# Patient Record
Sex: Female | Born: 1995 | Race: Black or African American | Hispanic: No | Marital: Single | Smoking: Never smoker
Health system: Southern US, Community
[De-identification: ages and names within clinical notes are randomized; demographics above are authoritative.]

## PROBLEM LIST (undated history)

## (undated) ENCOUNTER — Inpatient Hospital Stay (HOSPITAL_COMMUNITY): Payer: Self-pay

## (undated) DIAGNOSIS — F329 Major depressive disorder, single episode, unspecified: Secondary | ICD-10-CM

## (undated) DIAGNOSIS — M1990 Unspecified osteoarthritis, unspecified site: Secondary | ICD-10-CM

## (undated) DIAGNOSIS — J45909 Unspecified asthma, uncomplicated: Secondary | ICD-10-CM

## (undated) HISTORY — PX: TONSILLECTOMY: SUR1361

---

## 2015-07-31 ENCOUNTER — Encounter (HOSPITAL_COMMUNITY): Payer: Self-pay | Admitting: *Deleted

## 2015-07-31 ENCOUNTER — Emergency Department (HOSPITAL_COMMUNITY)
Admission: EM | Admit: 2015-07-31 | Discharge: 2015-07-31 | Disposition: A | Discharge status: Home / Self Care | Payer: Self-pay | Attending: Emergency Medicine | Admitting: Emergency Medicine

## 2015-07-31 ENCOUNTER — Emergency Department (HOSPITAL_COMMUNITY): Payer: Self-pay

## 2015-07-31 DIAGNOSIS — J069 Acute upper respiratory infection, unspecified: Secondary | ICD-10-CM | POA: Insufficient documentation

## 2015-07-31 DIAGNOSIS — Z8739 Personal history of other diseases of the musculoskeletal system and connective tissue: Secondary | ICD-10-CM | POA: Insufficient documentation

## 2015-07-31 DIAGNOSIS — Z880 Allergy status to penicillin: Secondary | ICD-10-CM | POA: Insufficient documentation

## 2015-07-31 DIAGNOSIS — J45901 Unspecified asthma with (acute) exacerbation: Secondary | ICD-10-CM | POA: Insufficient documentation

## 2015-07-31 DIAGNOSIS — F419 Anxiety disorder, unspecified: Secondary | ICD-10-CM | POA: Insufficient documentation

## 2015-07-31 HISTORY — DX: Unspecified osteoarthritis, unspecified site: M19.90

## 2015-07-31 HISTORY — DX: Unspecified asthma, uncomplicated: J45.909

## 2015-07-31 MED ORDER — PREDNISONE 50 MG PO TABS: ORAL_TABLET | ORAL | Status: DC

## 2015-07-31 MED ORDER — ALBUTEROL SULFATE HFA 108 (90 BASE) MCG/ACT IN AERS: 1.0000 | INHALATION_SPRAY | RESPIRATORY_TRACT | Status: AC | PRN

## 2015-07-31 MED ORDER — IPRATROPIUM-ALBUTEROL 0.5-2.5 (3) MG/3ML IN SOLN
3.0000 mL | Freq: Once | RESPIRATORY_TRACT | Status: AC
  Administered 2015-07-31: 3 mL via RESPIRATORY_TRACT
  Filled 2015-07-31: qty 3

## 2015-07-31 MED ORDER — PREDNISONE 20 MG PO TABS
50.0000 mg | ORAL_TABLET | Freq: Once | ORAL | Status: AC
  Administered 2015-07-31: 50 mg via ORAL
  Filled 2015-07-31: qty 3

## 2015-07-31 NOTE — ED Provider Notes (Signed)
CSN: [id number]     Arrival date & time 07/31/15  1240 History  By signing my name below, I, Soijett Blue, attest that this documentation has been prepared under the direction and in the presence of Alveta Heimlich, PA-C Electronically Signed: Soijett Blue, ED Scribe. 07/31/2015. 2:03 PM.   Chief Complaint  Patient presents with  . Asthma   The history is provided by the patient. No language interpreter was used.   Terri Stafford is a 19 y.o. female with a medical hx of asthma who presents to the Emergency Department complaining of moderate wheezing and shortness of breath onset last night. She is currently out of her rescue inhaler and her nebulizer is broken at this time. She notes that she has had asthma since she was 22 months old and has always used her nebulizer to self treat. She has been using her nebulizer periodically over the past two weeks, which is normal for her. She states that she has two nebulizer machines at home in [place]. She notes that she saw a medic at her work on Saturday who informed that she should come to the ED for further evaluation. Pt informs that she has had 53 admissions and 10 ICU admissions for her asthma related SOB. She has associated symptoms of non-productive cough and chest congestion x 2 weeks. She notes that she has not tried any medications for the relief of her symptoms. She denies any other symptoms. She goes to school at [employer] and does not have a PCP in the area. She used to have her inhaler and nebulizer Rx to her by her old PCP whom she stopped seeing when she came to college.   Past Medical History  Diagnosis Date  . Asthma   . Arthritis    History reviewed. No pertinent past surgical history. History reviewed. No pertinent family history. Social History  Substance Use Topics  . Smoking status: Never Smoker   . Smokeless tobacco: None  . Alcohol Use: No   OB History    No data available     Review of Systems  Constitutional: Negative  for fever and chills.  HENT: Negative for congestion, ear pain, rhinorrhea and sore throat.   Respiratory: Positive for cough, shortness of breath and wheezing. Negative for chest tightness.   Cardiovascular: Negative for chest pain.  Gastrointestinal: Negative for nausea, vomiting and abdominal pain.  Neurological: Negative for dizziness and headaches.  All other systems reviewed and are negative.   Allergies  Penicillins  Home Medications   Prior to Admission medications   Medication Sig Start Date End Date Taking? Authorizing Provider  albuterol (PROVENTIL HFA;VENTOLIN HFA) 108 (90 BASE) MCG/ACT inhaler Inhale 1-2 puffs into the lungs every 4 (four) hours as needed for wheezing or shortness of breath. 07/31/15   Rolm Gala Reve Crocket, PA-C  predniSONE (DELTASONE) 50 MG tablet Take once a day for 5 days 07/31/15   Windell Musson, PA-C   BP 115/70 mmHg  Pulse 70  Temp(Src) 98 F (36.7 C) (Oral)  Resp 20  SpO2 100%  LMP 07/28/2015 Physical Exam  Constitutional: She is oriented to person, place, and time. She appears well-developed and well-nourished. She appears distressed.  Patient very anxious on presentation to emergency department and is loudly hyperventilating  HENT:  Head: Normocephalic and atraumatic.  Right Ear: External ear normal.  Left Ear: External ear normal.  Eyes: Conjunctivae are normal. Right eye exhibits no discharge. Left eye exhibits no discharge. No scleral icterus.  Neck: Normal range of  motion.  Cardiovascular: Normal rate and normal heart sounds.   Pulmonary/Chest: Effort normal. No accessory muscle usage. No respiratory distress. She has no decreased breath sounds. She has wheezes.  On initial presentation, patient was anxious and breathing loudly masking any adventitious lung sounds. Mildly tachypneic between 22 and 24. After receiving DuoNeb, patient breathing unlabored with rare wheezes heard in upper lung fields. Good air movement in all lung fields. No  accessory muscle use.   Musculoskeletal: Normal range of motion.  Moves all extremities spontaneously  Neurological: She is alert and oriented to person, place, and time. Coordination normal.  Skin: Skin is warm and dry.  Psychiatric: She has a normal mood and affect. Her behavior is normal.  Nursing note and vitals reviewed.   ED Course  Procedures (including critical care time) DIAGNOSTIC STUDIES: Oxygen Saturation is 96% on RA, nl by my interpretation.    COORDINATION OF CARE: 2:01 PM Discussed treatment plan with pt at bedside which includes CXR, breathing treatment, prednisone Rx, inhaler Rx, referral and f/u with [facility], and pt agreed to plan. After receiving the breathing treatment, pt notes that she feels better and that her symptoms have been moderately relieved.   2:50 PM- Pt reassessed after receiving second breathing treatment and she notes that her symptoms are resolved at this time.   Labs Review Labs Reviewed - No data to display  Imaging Review Dg Chest 2 View  07/31/2015  CLINICAL DATA:  Cough, wheezing and shortness of breath for the past 2 weeks. Asthma. EXAM: CHEST  2 VIEW COMPARISON:  None. FINDINGS: Normal sized heart. Clear lungs. Central peribronchial thickening. Unremarkable bones. IMPRESSION: Mild bronchitic changes. Electronically Signed   By: Beckie SaltsSteven  Reid M.D.   On: 07/31/2015 14:14   I have personally reviewed and evaluated these images as part of my medical decision-making.   EKG Interpretation None      MDM   Final diagnoses:  Asthma exacerbation  URI (upper respiratory infection)   Patient presenting with asthma exacerbation. Treated with 2 duonebs in ED with significant improvement in lung sounds and symptoms. Chest x-ray with mild bronchitic changes. Patient ambulated in ED with O2 saturations maintained >90. There are no current signs of respiratory distress. Prednisone given in the ED and pt will be discharged with 5  day burst and albuterol. Instructed to get her breathing machine from home so she may continue her breathing treatments. Pt states she is breathing at baseline. Pt has been instructed to follow up with community health and wellness regarding today's ED visit. Return precautions given in discharge paperwork and discussed with pt at bedside. Pt stable for discharge  I personally performed the services described in this documentation, which was scribed in my presence. The recorded information has been reviewed and is accurate.    Alveta HeimlichStevi Tonantzin Mimnaugh, PA-C 07/31/15 1527  Eber HongBrian Miller, MD 08/02/15 [phone]

## 2015-07-31 NOTE — ED Notes (Signed)
Declined W/C at D/C and was escorted to lobby by RN. 

## 2015-07-31 NOTE — ED Notes (Signed)
Pt given warm blanked and stated "My legs and arms feels numb"

## 2015-07-31 NOTE — ED Notes (Signed)
PT states " MY oxygen is always 100% and I get admitted to ICU."

## 2015-07-31 NOTE — ED Notes (Signed)
PT requested  A facial mask for medication delivery . Pt stated "She was to weak to hold the NEB  Equipment."

## 2015-07-31 NOTE — ED Notes (Signed)
Pt reports hx of asthma, reports multiple trips to ED this past week due to cough and asthma. Is out of inhalers and reports her nebulizer is broken. Speaking in full sentences at triage.

## 2015-07-31 NOTE — ED Notes (Signed)
PT reports her legs and arms feel numb. PA made aware of Pt reported Sx'S.

## 2015-07-31 NOTE — Discharge Instructions (Signed)
Take 1 tablet of prednisone a day for 5 days. Use the albuterol rescue inhaler as needed for wheezing and shortness of breath. Schedule a follow-up appointment with community health and wellness. It is important to establish primary care and now that you are a resident of the area. Get your second nebulizer machine from home and use as needed. Return to the emergency department with fevers, severe shortness of breath, severe wheezing and any other new or concerning symptoms.   Asthma, Adult Asthma is a recurring condition in which the airways tighten and narrow. Asthma can make it difficult to breathe. It can cause coughing, wheezing, and shortness of breath. Asthma episodes, also called asthma attacks, range from minor to life-threatening. Asthma cannot be cured, but medicines and lifestyle changes can help control it. CAUSES Asthma is believed to be caused by inherited (genetic) and environmental factors, but its exact cause is unknown. Asthma may be triggered by allergens, lung infections, or irritants in the air. Asthma triggers are different for each person. Common triggers include:   Animal dander.  Dust mites.  Cockroaches.  Pollen from trees or grass.  Mold.  Smoke.  Air pollutants such as dust, household cleaners, hair sprays, aerosol sprays, paint fumes, strong chemicals, or strong odors.  Cold air, weather changes, and winds (which increase molds and pollens in the air).  Strong emotional expressions such as crying or laughing hard.  Stress.  Certain medicines (such as aspirin) or types of drugs (such as beta-blockers).  Sulfites in foods and drinks. Foods and drinks that may contain sulfites include dried fruit, potato chips, and sparkling grape juice.  Infections or inflammatory conditions such as the flu, a cold, or an inflammation of the nasal membranes (rhinitis).  Gastroesophageal reflux disease (GERD).  Exercise or strenuous activity. SYMPTOMS Symptoms may  occur immediately after asthma is triggered or many hours later. Symptoms include:  Wheezing.  Excessive nighttime or early morning coughing.  Frequent or severe coughing with a common cold.  Chest tightness.  Shortness of breath. DIAGNOSIS  The diagnosis of asthma is made by a review of your medical history and a physical exam. Tests may also be performed. These may include:  Lung function studies. These tests show how much air you breathe in and out.  Allergy tests.  Imaging tests such as X-rays. TREATMENT  Asthma cannot be cured, but it can usually be controlled. Treatment involves identifying and avoiding your asthma triggers. It also involves medicines. There are 2 classes of medicine used for asthma treatment:   Controller medicines. These prevent asthma symptoms from occurring. They are usually taken every day.  Reliever or rescue medicines. These quickly relieve asthma symptoms. They are used as needed and provide short-term relief. Your health care provider will help you create an asthma action plan. An asthma action plan is a written plan for managing and treating your asthma attacks. It includes a list of your asthma triggers and how they may be avoided. It also includes information on when medicines should be taken and when their dosage should be changed. An action plan may also involve the use of a device called a peak flow meter. A peak flow meter measures how well the lungs are working. It helps you monitor your condition. HOME CARE INSTRUCTIONS   Take medicines only as directed by your health care provider. Speak with your health care provider if you have questions about how or when to take the medicines.  Use a peak flow meter as directed  by your health care provider. Record and keep track of readings.  Understand and use the action plan to help minimize or stop an asthma attack without needing to seek medical care.  Control your home environment in the following  ways to help prevent asthma attacks:  Do not smoke. Avoid being exposed to secondhand smoke.  Change your heating and air conditioning filter regularly.  Limit your use of fireplaces and wood stoves.  Get rid of pests (such as roaches and mice) and their droppings.  Throw away plants if you see mold on them.  Clean your floors and dust regularly. Use unscented cleaning products.  Try to have someone else vacuum for you regularly. Stay out of rooms while they are being vacuumed and for a short while afterward. If you vacuum, use a dust mask from a hardware store, a double-layered or microfilter vacuum cleaner bag, or a vacuum cleaner with a HEPA filter.  Replace carpet with wood, tile, or vinyl flooring. Carpet can trap dander and dust.  Use allergy-proof pillows, mattress covers, and box spring covers.  Wash bed sheets and blankets every week in hot water and dry them in a dryer.  Use blankets that are made of polyester or cotton.  Clean bathrooms and kitchens with bleach. If possible, have someone repaint the walls in these rooms with mold-resistant paint. Keep out of the rooms that are being cleaned and painted.  Wash hands frequently. SEEK MEDICAL CARE IF:   You have wheezing, shortness of breath, or a cough even if taking medicine to prevent attacks.  The colored mucus you cough up (sputum) is thicker than usual.  Your sputum changes from clear or white to yellow, green, gray, or bloody.  You have any problems that may be related to the medicines you are taking (such as a rash, itching, swelling, or trouble breathing).  You are using a reliever medicine more than 2-3 times per week.  Your peak flow is still at 50-79% of your personal best after following your action plan for 1 hour.  You have a fever. SEEK IMMEDIATE MEDICAL CARE IF:   You seem to be getting worse and are unresponsive to treatment during an asthma attack.  You are short of breath even at rest.  You  get short of breath when doing very little physical activity.  You have difficulty eating, drinking, or talking due to asthma symptoms.  You develop chest pain.  You develop a fast heartbeat.  You have a bluish color to your lips or fingernails.  You are light-headed, dizzy, or faint.  Your peak flow is less than 50% of your personal best.   This information is not intended to replace advice given to you by your health care provider. Make sure you discuss any questions you have with your health care provider.   Document Released: 08/19/2005 Document Revised: 05/10/2015 Document Reviewed: 03/18/2013 Elsevier Interactive Patient Education 2016 [employer]  Asthma, Acute Bronchospasm Acute bronchospasm caused by asthma is also referred to as an asthma attack. Bronchospasm means your air passages become narrowed. The narrowing is caused by inflammation and tightening of the muscles in the air tubes (bronchi) in your lungs. This can make it hard to breathe or cause you to wheeze and cough. CAUSES Possible triggers are:  Animal dander from the skin, hair, or feathers of animals.  Dust mites contained in house dust.  Cockroaches.  Pollen from trees or grass.  Mold.  Cigarette or tobacco smoke.  Air pollutants  such as dust, household cleaners, hair sprays, aerosol sprays, paint fumes, strong chemicals, or strong odors.  Cold air or weather changes. Cold air may trigger inflammation. Winds increase molds and pollens in the air.  Strong emotions such as crying or laughing hard.  Stress.  Certain medicines such as aspirin or beta-blockers.  Sulfites in foods and drinks, such as dried fruits and wine.  Infections or inflammatory conditions, such as a flu, cold, or inflammation of the nasal membranes (rhinitis).  Gastroesophageal reflux disease (GERD). GERD is a condition where stomach acid backs up into your esophagus.  Exercise or strenuous activity. SIGNS AND SYMPTOMS     Wheezing.  Excessive coughing, particularly at night.  Chest tightness.  Shortness of breath. DIAGNOSIS  Your health care provider will ask you about your medical history and perform a physical exam. A chest X-ray or blood testing may be performed to look for other causes of your symptoms or other conditions that may have triggered your asthma attack. TREATMENT  Treatment is aimed at reducing inflammation and opening up the airways in your lungs. Most asthma attacks are treated with inhaled medicines. These include quick relief or rescue medicines (such as bronchodilators) and controller medicines (such as inhaled corticosteroids). These medicines are sometimes given through an inhaler or a nebulizer. Systemic steroid medicine taken by mouth or given through an IV tube also can be used to reduce the inflammation when an attack is moderate or severe. Antibiotic medicines are only used if a bacterial infection is present.  HOME CARE INSTRUCTIONS   Rest.  Drink plenty of liquids. This helps the mucus to remain thin and be easily coughed up. Only use caffeine in moderation and do not use alcohol until you have recovered from your illness.  Do not smoke. Avoid being exposed to secondhand smoke.  You play a critical role in keeping yourself in good health. Avoid exposure to things that cause you to wheeze or to have breathing problems.  Keep your medicines up-to-date and available. Carefully follow your health care provider's treatment plan.  Take your medicine exactly as prescribed.  When pollen or pollution is bad, keep windows closed and use an air conditioner or go to places with air conditioning.  Asthma requires careful medical care. See your health care provider for a follow-up as advised. If you are more than [redacted] weeks pregnant and you were prescribed any new medicines, let your obstetrician know about the visit and how you are doing. Follow up with your health care provider as  directed.  After you have recovered from your asthma attack, make an appointment with your outpatient doctor to talk about ways to reduce the likelihood of future attacks. If you do not have a doctor who manages your asthma, make an appointment with a primary care doctor to discuss your asthma. SEEK IMMEDIATE MEDICAL CARE IF:   You are getting worse.  You have trouble breathing. If severe, call your local emergency services (911 in the [place]  You develop chest pain or discomfort.  You are vomiting.  You are not able to keep fluids down.  You are coughing up yellow, green, brown, or bloody sputum.  You have a fever and your symptoms suddenly get worse.  You have trouble swallowing. MAKE SURE YOU:   Understand these instructions.  Will watch your condition.  Will get help right away if you are not doing well or get worse.   This information is not intended to replace advice given to you  by your health care provider. Make sure you discuss any questions you have with your health care provider.   Document Released: 12/04/2006 Document Revised: 08/24/2013 Document Reviewed: 02/24/2013 Elsevier Interactive Patient Education [employer]

## 2015-07-31 NOTE — ED Notes (Signed)
Pt placed on monitor, pt o2 100% room air heart rate 74. Pt able to answer question in full sentences.

## 2015-12-27 ENCOUNTER — Emergency Department (HOSPITAL_COMMUNITY)
Admission: EM | Admit: 2015-12-27 | Discharge: 2015-12-28 | Disposition: A | Discharge status: Other Acute Inpatient Hospital | Payer: Self-pay | Attending: Emergency Medicine | Admitting: Emergency Medicine

## 2015-12-27 ENCOUNTER — Encounter (HOSPITAL_COMMUNITY): Payer: Self-pay

## 2015-12-27 DIAGNOSIS — Z79899 Other long term (current) drug therapy: Secondary | ICD-10-CM | POA: Insufficient documentation

## 2015-12-27 DIAGNOSIS — R45851 Suicidal ideations: Secondary | ICD-10-CM

## 2015-12-27 DIAGNOSIS — F329 Major depressive disorder, single episode, unspecified: Secondary | ICD-10-CM | POA: Insufficient documentation

## 2015-12-27 DIAGNOSIS — Z3202 Encounter for pregnancy test, result negative: Secondary | ICD-10-CM | POA: Insufficient documentation

## 2015-12-27 DIAGNOSIS — F1210 Cannabis abuse, uncomplicated: Secondary | ICD-10-CM | POA: Insufficient documentation

## 2015-12-27 DIAGNOSIS — Z880 Allergy status to penicillin: Secondary | ICD-10-CM | POA: Insufficient documentation

## 2015-12-27 DIAGNOSIS — J45909 Unspecified asthma, uncomplicated: Secondary | ICD-10-CM | POA: Insufficient documentation

## 2015-12-27 DIAGNOSIS — F32A Depression, unspecified: Secondary | ICD-10-CM

## 2015-12-27 LAB — COMPREHENSIVE METABOLIC PANEL
ALT: 16 U/L (ref 14–54)
AST: 19 U/L (ref 15–41)
Albumin: 4.5 g/dL (ref 3.5–5.0)
Alkaline Phosphatase: 69 U/L (ref 38–126)
Anion gap: 9 (ref 5–15)
BILIRUBIN TOTAL: 0.6 mg/dL (ref 0.3–1.2)
BUN: 17 mg/dL (ref 6–20)
CALCIUM: 10.3 mg/dL (ref 8.9–10.3)
CHLORIDE: 105 mmol/L (ref 101–111)
CO2: 27 mmol/L (ref 22–32)
CREATININE: 0.77 mg/dL (ref 0.44–1.00)
Glucose, Bld: 89 mg/dL (ref 65–99)
Potassium: 4.2 mmol/L (ref 3.5–5.1)
Sodium: 141 mmol/L (ref 135–145)
TOTAL PROTEIN: 8 g/dL (ref 6.5–8.1)

## 2015-12-27 LAB — CBC
HCT: 36.9 % (ref 36.0–46.0)
Hemoglobin: 12.1 g/dL (ref 12.0–15.0)
MCH: 26.4 pg (ref 26.0–34.0)
MCHC: 32.8 g/dL (ref 30.0–36.0)
MCV: 80.6 fL (ref 78.0–100.0)
PLATELETS: 544 10*3/uL — AB (ref 150–400)
RBC: 4.58 MIL/uL (ref 3.87–5.11)
RDW: 16.3 % — AB (ref 11.5–15.5)
WBC: 7.5 10*3/uL (ref 4.0–10.5)

## 2015-12-27 LAB — ACETAMINOPHEN LEVEL: Acetaminophen (Tylenol), Serum: <10 ug/mL — ABNORMAL LOW (ref 10–30)

## 2015-12-27 LAB — SALICYLATE LEVEL

## 2015-12-27 LAB — RAPID URINE DRUG SCREEN, HOSP PERFORMED
Amphetamines: NOT DETECTED
Barbiturates: NOT DETECTED
Benzodiazepines: NOT DETECTED
Cocaine: NOT DETECTED
OPIATES: NOT DETECTED
Tetrahydrocannabinol: POSITIVE — AB

## 2015-12-27 LAB — I-STAT BETA HCG BLOOD, ED (MC, WL, AP ONLY)

## 2015-12-27 LAB — ETHANOL

## 2015-12-27 MED ORDER — ACETAMINOPHEN 325 MG PO TABS: 650.0000 mg | ORAL_TABLET | ORAL | Status: DC | PRN

## 2015-12-27 MED ORDER — ZOLPIDEM TARTRATE 5 MG PO TABS
5.0000 mg | ORAL_TABLET | Freq: Every evening | ORAL | Status: DC | PRN
  Administered 2015-12-27: 5 mg via ORAL
  Filled 2015-12-27: qty 1

## 2015-12-27 MED ORDER — IBUPROFEN 200 MG PO TABS: 600.0000 mg | ORAL_TABLET | Freq: Three times a day (TID) | ORAL | Status: DC | PRN

## 2015-12-27 MED ORDER — ALBUTEROL SULFATE HFA 108 (90 BASE) MCG/ACT IN AERS: 1.0000 | INHALATION_SPRAY | RESPIRATORY_TRACT | Status: DC | PRN

## 2015-12-27 NOTE — ED Notes (Signed)
Patient states she is having suicidal thoughts with no plans. Patient denies Hi, visual or auditory hallucinations. Patient states she has a lot going on. Patient denies any drug or alcohol use. Patient tearful during triage.

## 2015-12-27 NOTE — ED Notes (Signed)
Patient's friend took belongings bag x2 home with her

## 2015-12-27 NOTE — BH Assessment (Signed)
Assessment Note  Terri Sianez is an 20 y.o. female. Patient presents to to [facility] for a TTS assessment. She was referred by her therapist at [facility] counseling center. Patient reports increased depression. Her depression is triggered by guilt. She is the mother of a 33 yr old boy. She left her son in [place], [place] to be raised by her family so that she is able to finish school. Patient feels bad that she is not around to raise her son. She is in her 3rd semester of college. Patient reports depressive symptoms of hopelessness, isolating self from others, crying spells, and fatigue. Patient is also grieving. Sts that the father of her son passed away 07-08-14. Patient has suicidal thoughts, no plan, no intent. She denies history of suicide attempts. She denies self mutilating behaviors. Patient however starves herself or refused to sleep as a form of punishing herself. She denies HI and AVH's. She is calm and cooperative. She reports regular use of THC.   Diagnosis: Major Depressive Disorder, Recurrent, Severe, without psychotic features  Past Medical History:  Past Medical History  Diagnosis Date  . Asthma   . Arthritis     Past Surgical History  Procedure Laterality Date  . Tonsillectomy      Family History: No family history on file.  Social History:  reports that she has never smoked. She has never used smokeless tobacco. She reports that she does not drink alcohol or use illicit drugs.  Additional Social History:  Alcohol / Drug Use Pain Medications: SEE MAR Prescriptions: SEE MAR Over the Counter: SEE MAR History of alcohol / drug use?: Yes Substance #1 Name of Substance 1: THC 1 - Age of First Use: 20 yrs old  1 - Amount (size/oz): varies 1 - Frequency: daily  1 - Duration: on-going 1 - Last Use / Amount: 2am this morning  CIWA: CIWA-Ar BP: 120/72 mmHg Pulse Rate: 62 COWS:    Allergies:  Allergies  Allergen Reactions  . Penicillins     Unknown    Home Medications:   (Not in a hospital admission)  OB/GYN Status:  Patient's last menstrual period was 12/20/2015.  General Assessment Data Location of Assessment: [facility] TTS Assessment: In system Is this a Tele or Face-to-Face Assessment?: Face-to-Face Is this an Initial Assessment or a Re-assessment for this encounter?: Initial Assessment Marital status: Single Maiden name:  (n/a) Is patient pregnant?: No Pregnancy Status: Yes (Comment: include estimated delivery date) Living Arrangements: Other (Comment), Alone Can pt return to current living arrangement?: Yes Admission Status: Voluntary Is patient capable of signing voluntary admission?: Yes Referral Source: Self/Family/Friend Insurance type:  (Self Pay "I let me Medicaid lapse")     Crisis Care Plan Living Arrangements: Other (Comment), Alone Legal Guardian:  (no legal guardian ) Name of Psychiatrist:  (no psychiatrist ) Name of Therapist:  (No therapist )  Education Status Is patient currently in school?: Yes Current Grade:  (n/a) Highest grade of school patient has completed:  (n/a) Name of school:  ([employer]) Contact person:  (n/a)  Risk to self with the past 6 months Suicidal Ideation: Yes-Currently Present Has patient been a risk to self within the past 6 months prior to admission? : Yes Suicidal Intent: No Has patient had any suicidal intent within the past 6 months prior to admission? : No Is patient at risk for suicide?: No Suicidal Plan?: No Has patient had any suicidal plan within the past 6 months prior to admission? : No Access to Means: No  What has been your use of drugs/alcohol within the last 12 months?:  (thc) Previous Attempts/Gestures: No How many times?:  (n/a) Other Self Harm Risks:  (n/a) Triggers for Past Attempts:  (no previous attempts or gestures ) Intentional Self Injurious Behavior:  ("I will not eat .Marland KitchenMarland KitchenMarland KitchenI wil not sleep to punish myself") Family Suicide History: No Recent stressful life event(s): Other  (Comment) (Child left in [place] while I finish school; bereavement) Persecutory voices/beliefs?: No Depression: Yes Depression Symptoms: Feeling angry/irritable, Feeling worthless/self pity, Loss of interest in usual pleasures, Guilt, Fatigue, Isolating, Tearfulness, Insomnia, Despondent Substance abuse history and/or treatment for substance abuse?: No Suicide prevention information given to non-admitted patients: Not applicable  Risk to Others within the past 6 months Homicidal Ideation: No Does patient have any lifetime risk of violence toward others beyond the six months prior to admission? : No Thoughts of Harm to Others: No Current Homicidal Intent: No Current Homicidal Plan: No Access to Homicidal Means: No Identified Victim:  (n/a) History of harm to others?: No Assessment of Violence: None Noted Violent Behavior Description:  (patient is calm and cooperative ) Does patient have access to weapons?: No Criminal Charges Pending?: No Does patient have a court date: No Is patient on probation?: No  Psychosis Hallucinations: None noted Delusions: None noted  Mental Status Report Appearance/Hygiene: Disheveled Eye Contact: Fair Motor Activity: Freedom of movement Speech: Logical/coherent Level of Consciousness: Alert Mood: Depressed Affect: Appropriate to circumstance Anxiety Level: None Thought Processes: Coherent, Relevant Judgement: Impaired Orientation: Person, Place, Time, Situation Obsessive Compulsive Thoughts/Behaviors: None  Cognitive Functioning Concentration: Decreased Memory: Recent Intact, Remote Intact IQ: Average Insight: Good Impulse Control: Fair Appetite: Good Weight Loss:  ("My weight fluctuates") Weight Gain:  ("My wt. fluctuates") Sleep: Decreased Total Hours of Sleep:  (varies ) Vegetative Symptoms: None  ADLScreening [facility] Assessment Services) Patient's cognitive ability adequate to safely complete daily activities?: Yes Patient able  to express need for assistance with ADLs?: Yes Independently performs ADLs?: Yes (appropriate for developmental age)  Prior Inpatient Therapy Prior Inpatient Therapy: No Prior Therapy Dates:  (n/a) Prior Therapy Facilty/Provider(s):  (n/a) Reason for Treatment:  (n/a)  Prior Outpatient Therapy Prior Outpatient Therapy: Yes Prior Therapy Dates:  (current ) Prior Therapy Facilty/Provider(s):  ([employer] counseling department) Reason for Treatment:  (depression ) Does patient have an ACCT team?: No Does patient have Intensive In-House Services?  : No Does patient have [facility] services? : No Does patient have P4CC services?: No  ADL Screening (condition at time of admission) Patient's cognitive ability adequate to safely complete daily activities?: Yes Is the patient deaf or have difficulty hearing?: No Does the patient have difficulty seeing, even when wearing glasses/contacts?: No Does the patient have difficulty concentrating, remembering, or making decisions?: No Patient able to express need for assistance with ADLs?: Yes Does the patient have difficulty dressing or bathing?: No Independently performs ADLs?: Yes (appropriate for developmental age) Does the patient have difficulty walking or climbing stairs?: No Weakness of Legs: None Weakness of Arms/Hands: None  Home Assistive Devices/Equipment Home Assistive Devices/Equipment: None    Abuse/Neglect Assessment (Assessment to be complete while patient is alone) Physical Abuse: Denies Verbal Abuse: Denies Sexual Abuse: Denies Exploitation of patient/patient's resources: Denies Self-Neglect: Denies Values / Beliefs Cultural Requests During Hospitalization: None Spiritual Requests During Hospitalization: None   Advance Directives (For Healthcare) Does patient have an advance directive?: No Would patient like information on creating an advanced directive?: No - patient declined information Nutrition Screen- [facility]  Adult/[facility] Patient's  home diet: Regular  Additional Information 1:1 In Past 12 Months?: No CIRT Risk: No Elopement Risk: No Does patient have medical clearance?: No     Disposition:  Disposition Initial Assessment Completed for this Encounter: Yes Disposition of Patient: Inpatient treatment program (Per Julieanne CottonJosephine, NP patient meets criteria for OBS or INPT) Type of inpatient treatment program: Adult  On Site Evaluation by:   Reviewed with Physician:    Melynda RipplePerry, Daisia Slomski [facility] 12/27/2015 6:30 PM

## 2015-12-27 NOTE — ED Provider Notes (Signed)
CSN: [id number]     Arrival date & time 12/27/15  1429 History   First MD Initiated Contact with Patient 12/27/15 1550     Chief Complaint  Patient presents with  . Suicidal     (Consider location/radiation/quality/duration/timing/severity/associated sxs/prior Treatment) HPI Comments: PT with a hx of arthritis and asthma presents with depression and SI.  No specific plan.  No hx of suicide attempts in the past.  Pt denies any medical complaints.  Spoke with school counselor today and was advised to come here.   Past Medical History  Diagnosis Date  . Asthma   . Arthritis    Past Surgical History  Procedure Laterality Date  . Tonsillectomy     No family history on file. Social History  Substance Use Topics  . Smoking status: Never Smoker   . Smokeless tobacco: Never Used  . Alcohol Use: No   OB History    No data available     Review of Systems  Constitutional: Negative for fever, chills, diaphoresis and fatigue.  HENT: Negative for congestion, rhinorrhea and sneezing.   Eyes: Negative.   Respiratory: Negative for cough, chest tightness and shortness of breath.   Cardiovascular: Negative for chest pain and leg swelling.  Gastrointestinal: Negative for nausea, vomiting, abdominal pain, diarrhea and blood in stool.  Genitourinary: Negative for frequency, hematuria, flank pain and difficulty urinating.  Musculoskeletal: Negative for back pain and arthralgias.  Skin: Negative for rash.  Neurological: Negative for dizziness, speech difficulty, weakness, numbness and headaches.  Psychiatric/Behavioral: Positive for suicidal ideas and dysphoric mood. Negative for self-injury.      Allergies  Penicillins  Home Medications   Prior to Admission medications   Medication Sig Start Date End Date Taking? Authorizing Provider  albuterol (PROVENTIL HFA;VENTOLIN HFA) 108 (90 BASE) MCG/ACT inhaler Inhale 1-2 puffs into the lungs every 4 (four) hours as needed for wheezing or  shortness of breath. 07/31/15   Rolm GalaStevi Barrett, PA-C  predniSONE (DELTASONE) 50 MG tablet Take once a day for 5 days Patient not taking: Reported on 12/27/2015 07/31/15   Stevi Barrett, PA-C   BP 120/72 mmHg  Pulse 62  Temp(Src) 98.6 F (37 C) (Oral)  Resp 16  Ht 5\' 2"  (1.575 m)  Wt 149 lb 6 oz (67.756 kg)  BMI 27.31 kg/m2  SpO2 97%  LMP 12/20/2015 Physical Exam  Constitutional: She is oriented to person, place, and time. She appears well-developed and well-nourished.  HENT:  Head: Normocephalic and atraumatic.  Eyes: Pupils are equal, round, and reactive to light.  Neck: Normal range of motion. Neck supple.  Cardiovascular: Normal rate, regular rhythm and normal heart sounds.   Pulmonary/Chest: Effort normal and breath sounds normal. No respiratory distress. She has no wheezes. She has no rales. She exhibits no tenderness.  Abdominal: Soft. Bowel sounds are normal. There is no tenderness. There is no rebound and no guarding.  Musculoskeletal: Normal range of motion. She exhibits no edema.  Lymphadenopathy:    She has no cervical adenopathy.  Neurological: She is alert and oriented to person, place, and time.  Skin: Skin is warm and dry. No rash noted.  Psychiatric: She has a normal mood and affect.    ED Course  Procedures (including critical care time) Labs Review Results for orders placed or performed during the hospital encounter of 12/27/15  Comprehensive metabolic panel  Result Value Ref Range   Sodium 141 135 - 145 mmol/L   Potassium 4.2 3.5 - 5.1 mmol/L   Chloride  105 101 - 111 mmol/L   CO2 27 22 - 32 mmol/L   Glucose, Bld 89 65 - 99 mg/dL   BUN 17 6 - 20 mg/dL   Creatinine, Ser 0.45 0.44 - 1.00 mg/dL   Calcium 40.9 8.9 - 81.1 mg/dL   Total Protein 8.0 6.5 - 8.1 g/dL   Albumin 4.5 3.5 - 5.0 g/dL   AST 19 15 - 41 U/L   ALT 16 14 - 54 U/L   Alkaline Phosphatase 69 38 - 126 U/L   Total Bilirubin 0.6 0.3 - 1.2 mg/dL   GFR calc non Af Amer >60 >60 mL/min   GFR  calc Af Amer >60 >60 mL/min   Anion gap 9 5 - 15  Ethanol (ETOH)  Result Value Ref Range   Alcohol, Ethyl (B) <5 <5 mg/dL  Salicylate level  Result Value Ref Range   Salicylate Lvl <4.0 2.8 - 30.0 mg/dL  Acetaminophen level  Result Value Ref Range   Acetaminophen (Tylenol), Serum <10 (L) 10 - 30 ug/mL  CBC  Result Value Ref Range   WBC 7.5 4.0 - 10.5 K/uL   RBC 4.58 3.87 - 5.11 MIL/uL   Hemoglobin 12.1 12.0 - 15.0 g/dL   HCT 91.4 78.2 - 95.6 %   MCV 80.6 78.0 - 100.0 fL   MCH 26.4 26.0 - 34.0 pg   MCHC 32.8 30.0 - 36.0 g/dL   RDW 21.3 (H) 08.6 - 57.8 %   Platelets 544 (H) 150 - 400 K/uL  Urine rapid drug screen (hosp performed) (Not at [facility])  Result Value Ref Range   Opiates NONE DETECTED NONE DETECTED   Cocaine NONE DETECTED NONE DETECTED   Benzodiazepines NONE DETECTED NONE DETECTED   Amphetamines NONE DETECTED NONE DETECTED   Tetrahydrocannabinol POSITIVE (A) NONE DETECTED   Barbiturates NONE DETECTED NONE DETECTED  I-Stat beta hCG blood, ED ([facility], [facility], [facility] only)  Result Value Ref Range   I-stat hCG, quantitative <5.0 <5 mIU/mL   Comment 3           No results found.    Imaging Review No results found. I have personally reviewed and evaluated these images and lab results as part of my medical decision-making.   EKG Interpretation None      MDM   Final diagnoses:  Depression  Suicidal ideation    Pt has been medically cleared.  Awaiting psych assessment    Rolan Bucco, MD 12/27/15 [phone]

## 2015-12-28 ENCOUNTER — Encounter (HOSPITAL_COMMUNITY): Payer: Self-pay

## 2015-12-28 ENCOUNTER — Inpatient Hospital Stay (HOSPITAL_COMMUNITY)
Admission: EM | Admit: 2015-12-28 | Discharge: 2016-01-01 | DRG: 885 | Disposition: A | Discharge status: Home / Self Care | Payer: Federal, State, Local not specified - Other | Source: Intra-hospital | Attending: Psychiatry | Admitting: Psychiatry

## 2015-12-28 ENCOUNTER — Inpatient Hospital Stay (HOSPITAL_COMMUNITY): Admit: 2015-12-28 | Payer: Self-pay

## 2015-12-28 DIAGNOSIS — F332 Major depressive disorder, recurrent severe without psychotic features: Secondary | ICD-10-CM | POA: Diagnosis not present

## 2015-12-28 DIAGNOSIS — F329 Major depressive disorder, single episode, unspecified: Secondary | ICD-10-CM | POA: Diagnosis present

## 2015-12-28 DIAGNOSIS — R45851 Suicidal ideations: Secondary | ICD-10-CM | POA: Diagnosis present

## 2015-12-28 MED ORDER — ACETAMINOPHEN 325 MG PO TABS: 650.0000 mg | ORAL_TABLET | Freq: Four times a day (QID) | ORAL | Status: DC | PRN

## 2015-12-28 MED ORDER — ALUM & MAG HYDROXIDE-SIMETH 200-200-20 MG/5ML PO SUSP: 30.0000 mL | Freq: Four times a day (QID) | ORAL | Status: DC | PRN

## 2015-12-28 MED ORDER — ENSURE ENLIVE PO LIQD
237.0000 mL | Freq: Two times a day (BID) | ORAL | Status: DC
  Administered 2015-12-28 – 2016-01-01 (×2): 237 mL via ORAL

## 2015-12-28 MED ORDER — TRAZODONE HCL 50 MG PO TABS
50.0000 mg | ORAL_TABLET | Freq: Every evening | ORAL | Status: DC | PRN
  Administered 2015-12-28 – 2015-12-29 (×2): 50 mg via ORAL
  Filled 2015-12-28 (×8): qty 1

## 2015-12-28 MED ORDER — ALUM & MAG HYDROXIDE-SIMETH 200-200-20 MG/5ML PO SUSP: 30.0000 mL | ORAL | Status: DC | PRN

## 2015-12-28 MED ORDER — ALBUTEROL SULFATE HFA 108 (90 BASE) MCG/ACT IN AERS
1.0000 | INHALATION_SPRAY | RESPIRATORY_TRACT | Status: DC | PRN
  Administered 2015-12-28: 2 via RESPIRATORY_TRACT
  Filled 2015-12-28: qty 6.7

## 2015-12-28 MED ORDER — MAGNESIUM HYDROXIDE 400 MG/5ML PO SUSP: 30.0000 mL | Freq: Every day | ORAL | Status: DC | PRN

## 2015-12-28 MED ORDER — LORAZEPAM 1 MG PO TABS
1.0000 mg | ORAL_TABLET | Freq: Once | ORAL | Status: AC
  Administered 2015-12-28: 1 mg via ORAL
  Filled 2015-12-28: qty 1

## 2015-12-28 MED ORDER — SERTRALINE HCL 25 MG PO TABS
25.0000 mg | ORAL_TABLET | Freq: Every day | ORAL | Status: DC
  Administered 2015-12-28: 25 mg via ORAL
  Filled 2015-12-28 (×5): qty 1

## 2015-12-28 MED ORDER — HYDROXYZINE HCL 25 MG PO TABS
25.0000 mg | ORAL_TABLET | Freq: Four times a day (QID) | ORAL | Status: DC | PRN
  Administered 2015-12-28 – 2015-12-31 (×2): 25 mg via ORAL
  Filled 2015-12-28: qty 1
  Filled 2015-12-28: qty 10
  Filled 2015-12-28: qty 1

## 2015-12-28 NOTE — Tx Team (Signed)
Initial Interdisciplinary Treatment Plan   PATIENT STRESSORS: Educational concerns Financial difficulties Occupational concerns Substance abuse   PATIENT STRENGTHS: Ability for insight Average or above average intelligence Capable of independent living Communication skills General fund of knowledge Motivation for treatment/growth Physical Health Supportive family/friends   PROBLEM LIST: Problem List/Patient Goals Date to be addressed Date deferred Reason deferred Estimated date of resolution  "depression" 12/28/2015   D/c  "anxiety" 12/28/2015   D/c  "suicidal thoughts" 12/28/2015   D/c  "substance abuse" 12/28/2015                                    DISCHARGE CRITERIA:  Ability to meet basic life and health needs Adequate post-discharge living arrangements Improved stabilization in mood, thinking, and/or behavior Medical problems require only outpatient monitoring Motivation to continue treatment in a less acute level of care Need for constant or close observation no longer present Reduction of life-threatening or endangering symptoms to within safe limits Safe-care adequate arrangements made Verbal commitment to aftercare and medication compliance  PRELIMINARY DISCHARGE PLAN: Attend aftercare/continuing care group Attend PHP/IOP Attend 12-step recovery group Outpatient therapy Return to previous living arrangement Return to previous work or school arrangements  PATIENT/FAMIILY INVOLVEMENT: This treatment plan has been presented to and reviewed with the patient, Terri Stafford.  The patient and family have been given the opportunity to ask questions and make suggestions.  [name], [name] 12/28/2015, 4:30 PM

## 2015-12-28 NOTE — Progress Notes (Addendum)
Patient [age] yrs old, [facility].  Stated she is very stressed about college, has small child [place] that [place], child's father died. [facility] for safety.  Denied HI.  Denied A/V hallucinations.  Denied pain.  Presently patient resting in bed with eyes closed.  Respirations even and unlabored.  No signs/symptoms of pain/distress noted on patient's face/body movements. Fall risk assessment completed, low fall risk. No locker needed for patient's belongings.   Patient oriented to 400 hall.  Offered food/drink.

## 2015-12-28 NOTE — H&P (Signed)
[facility] Provider Admission PAA/H&P  Patient Identification: Terri Stafford MRN:  [id number] Date of Evaluation:  12/28/2015 Chief Complaint:  MDD with Psych Features Principal Diagnosis: MDD (major depressive disorder), recurrent episode, severe ([place]) Diagnosis:   Patient Active Problem List   Diagnosis Date Noted  . MDD (major depressive disorder), recurrent episode, severe ([place]) [F33.2] 12/28/2015  . MDD (major depressive disorder) ([place]) [F32.9] 12/28/2015   History of Present Illness: Terri Stafford is an 20 y.o. female. Patient presents to to [facility] for a TTS assessment. She was referred by her therapist at [facility] counseling center. Patient reports increased depression. Her depression is triggered by guilt. She is the mother of a 33 yr old boy. She left her son in [place], [place] to be raised by her family so that she is able to finish school. Patient feels bad that she is not around to raise her son. She is in her 71rd semester of college. Patient reports depressive symptoms of hopelessness, isolating self from others, crying spells, and fatigue. Patient is also grieving. Sts that the father of her son passed away 07/16/14. Patient has suicidal thoughts, no plan, no intent. She denies history of suicide attempts. She denies self mutilating behaviors. Patient however starves herself or refused to sleep as a form of punishing herself. She denies HI and AVH's. She is calm and cooperative. She reports regular use of THC.   Associated Signs/Symptoms: Depression Symptoms:  depressed mood, anxiety, (Hypo) Manic Symptoms:  Labiality of Mood, Anxiety Symptoms:  Excessive Worry, Psychotic Symptoms:  NA PTSD Symptoms: NA Total Time spent with patient: 45 minutes  Past Psychiatric History: see above noted  Is the patient at risk to self? Yes.    Has the patient been a risk to self in the past 6 months? Yes.    Has the patient been a risk to self within the distant past? Yes.    Is the  patient a risk to others? No.  Has the patient been a risk to others in the past 6 months? No.  Has the patient been a risk to others within the distant past? No.   Prior Inpatient Therapy:   Prior Outpatient Therapy:    Alcohol Screening:   Substance Abuse History in the last 12 months:  No. Consequences of Substance Abuse: NA Previous Psychotropic Medications: Yes  Psychological Evaluations: Yes  Past Medical History:  Past Medical History  Diagnosis Date  . Asthma   . Arthritis     Past Surgical History  Procedure Laterality Date  . Tonsillectomy     Family History: History reviewed. No pertinent family history. Family Psychiatric History: Denies Tobacco Screening: '@FLOW' [phone]::1)@ Social History:  History  Alcohol Use No     History  Drug Use No    Additional Social History:      Pain Medications: SEE MAR Prescriptions: SEE MAR Over the Counter: SEE MAR History of alcohol / drug use?: Yes Name of Substance 1: THC 1 - Age of First Use: 20 yrs old  1 - Amount (size/oz): varies 1 - Frequency: daily  1 - Duration: on-going 1 - Last Use / Amount: 2am this morning  Allergies:   Allergies  Allergen Reactions  . Penicillins     Unknown   Lab Results:  Results for orders placed or performed during the hospital encounter of 12/27/15 (from the past 48 hour(s))  Comprehensive metabolic panel     Status: None   Collection Time: 12/27/15  3:44 PM  Result Value  Ref Range   Sodium 141 135 - 145 mmol/L   Potassium 4.2 3.5 - 5.1 mmol/L   Chloride 105 101 - 111 mmol/L   CO2 27 22 - 32 mmol/L   Glucose, Bld 89 65 - 99 mg/dL   BUN 17 6 - 20 mg/dL   Creatinine, Ser 0.77 0.44 - 1.00 mg/dL   Calcium 10.3 8.9 - 10.3 mg/dL   Total Protein 8.0 6.5 - 8.1 g/dL   Albumin 4.5 3.5 - 5.0 g/dL   AST 19 15 - 41 U/L   ALT 16 14 - 54 U/L   Alkaline Phosphatase 69 38 - 126 U/L   Total Bilirubin 0.6 0.3 - 1.2 mg/dL   GFR calc non Af Amer >60 >60 mL/min   GFR calc Af Amer  >60 >60 mL/min    Comment: (NOTE) The eGFR has been calculated using the CKD EPI equation. This calculation has not been validated in all clinical situations. eGFR's persistently <60 mL/min signify possible Chronic Kidney Disease.    Anion gap 9 5 - 15  Ethanol (ETOH)     Status: None   Collection Time: 12/27/15  3:44 PM  Result Value Ref Range   Alcohol, Ethyl (B) <5 <5 mg/dL    Comment:        LOWEST DETECTABLE LIMIT FOR SERUM ALCOHOL IS 5 mg/dL FOR MEDICAL PURPOSES ONLY   Salicylate level     Status: None   Collection Time: 12/27/15  3:44 PM  Result Value Ref Range   Salicylate Lvl <6.8 2.8 - 30.0 mg/dL  Acetaminophen level     Status: Abnormal   Collection Time: 12/27/15  3:44 PM  Result Value Ref Range   Acetaminophen (Tylenol), Serum <10 (L) 10 - 30 ug/mL    Comment:        THERAPEUTIC CONCENTRATIONS VARY SIGNIFICANTLY. A RANGE OF 10-30 ug/mL MAY BE AN EFFECTIVE CONCENTRATION FOR MANY PATIENTS. HOWEVER, SOME ARE BEST TREATED AT CONCENTRATIONS OUTSIDE THIS RANGE. ACETAMINOPHEN CONCENTRATIONS >150 ug/mL AT 4 HOURS AFTER INGESTION AND >50 ug/mL AT 12 HOURS AFTER INGESTION ARE OFTEN ASSOCIATED WITH TOXIC REACTIONS.   CBC     Status: Abnormal   Collection Time: 12/27/15  3:44 PM  Result Value Ref Range   WBC 7.5 4.0 - 10.5 K/uL   RBC 4.58 3.87 - 5.11 MIL/uL   Hemoglobin 12.1 12.0 - 15.0 g/dL   HCT 36.9 36.0 - 46.0 %   MCV 80.6 78.0 - 100.0 fL   MCH 26.4 26.0 - 34.0 pg   MCHC 32.8 30.0 - 36.0 g/dL   RDW 16.3 (H) 11.5 - 15.5 %   Platelets 544 (H) 150 - 400 K/uL  I-Stat beta hCG blood, ED ([facility], [facility], [facility] only)     Status: None   Collection Time: 12/27/15  3:50 PM  Result Value Ref Range   I-stat hCG, quantitative <5.0 <5 mIU/mL   Comment 3            Comment:   GEST. AGE      CONC.  (mIU/mL)   <=1 WEEK        5 - 50     2 WEEKS       50 - 500     3 WEEKS       100 - 10,000     4 WEEKS     1,000 - 30,000        FEMALE AND NON-PREGNANT FEMALE:     LESS THAN 5  mIU/mL  Urine rapid drug screen (hosp performed) (Not at [facility])     Status: Abnormal   Collection Time: 12/27/15  4:40 PM  Result Value Ref Range   Opiates NONE DETECTED NONE DETECTED   Cocaine NONE DETECTED NONE DETECTED   Benzodiazepines NONE DETECTED NONE DETECTED   Amphetamines NONE DETECTED NONE DETECTED   Tetrahydrocannabinol POSITIVE (A) NONE DETECTED   Barbiturates NONE DETECTED NONE DETECTED    Comment:        DRUG SCREEN FOR MEDICAL PURPOSES ONLY.  IF CONFIRMATION IS NEEDED FOR ANY PURPOSE, NOTIFY LAB WITHIN 5 DAYS.        LOWEST DETECTABLE LIMITS FOR URINE DRUG SCREEN Drug Class       Cutoff (ng/mL) Amphetamine      1000 Barbiturate      200 Benzodiazepine   322 Tricyclics       025 Opiates          300 Cocaine          300 THC              50     Blood Alcohol level:  Lab Results  Component Value Date   ETH <5 42/70/6237    Metabolic Disorder Labs:  No results found for: HGBA1C, MPG No results found for: PROLACTIN No results found for: CHOL, TRIG, HDL, CHOLHDL, VLDL, LDLCALC  Current Medications: Current Facility-Administered Medications  Medication Dose Route Frequency Provider Last Rate Last Dose  . acetaminophen (TYLENOL) tablet 650 mg  650 mg Oral Q6H PRN Kerrie Buffalo, NP      . albuterol (PROVENTIL HFA;VENTOLIN HFA) 108 (90 Base) MCG/ACT inhaler 1-2 puff  1-2 puff Inhalation Q4H PRN Laverle Hobby, PA-C   2 puff at 12/28/15 0318  . alum & mag hydroxide-simeth (MAALOX/MYLANTA) 200-200-20 MG/5ML suspension 30 mL  30 mL Oral Q4H PRN Kerrie Buffalo, NP      . feeding supplement (ENSURE ENLIVE) (ENSURE ENLIVE) liquid 237 mL  237 mL Oral BID BM Kerrie Buffalo, NP   237 mL at 12/28/15 1449  . hydrOXYzine (ATARAX/VISTARIL) tablet 25 mg  25 mg Oral Q6H PRN Laverle Hobby, PA-C   25 mg at 12/28/15 0220  . magnesium hydroxide (MILK OF MAGNESIA) suspension 30 mL  30 mL Oral Daily PRN Kerrie Buffalo, NP      . sertraline (ZOLOFT) tablet 25 mg  25 mg Oral  Daily Kerrie Buffalo, NP      . traZODone (DESYREL) tablet 50 mg  50 mg Oral QHS,MR X 1 Laverle Hobby, PA-C       PTA Medications: Prescriptions prior to admission  Medication Sig Dispense Refill Last Dose  . albuterol (PROVENTIL HFA;VENTOLIN HFA) 108 (90 BASE) MCG/ACT inhaler Inhale 1-2 puffs into the lungs every 4 (four) hours as needed for wheezing or shortness of breath. 1 Inhaler 0 unknown  . predniSONE (DELTASONE) 50 MG tablet Take once a day for 5 days (Patient not taking: Reported on 12/27/2015) 5 tablet 0 Not Taking at Unknown time    Musculoskeletal: Strength & Muscle Tone: within normal limits Gait & Station: normal Patient leans: Right and N/A  Psychiatric Specialty Exam: Physical Exam  Vitals reviewed. Psychiatric: Her mood appears anxious. Her affect is labile. She is withdrawn. She exhibits a depressed mood.   Review of Systems  Psychiatric/Behavioral: Positive for depression. Negative for hallucinations. The patient is nervous/anxious.    Blood pressure 107/69, pulse 64, temperature 98.5 F (36.9 C), temperature source Oral, resp. rate 18, height  '5\' 2"'  (1.575 m), weight 67.132 kg (148 lb), last menstrual period 12/20/2015, SpO2 100 %.Body mass index is 27.06 kg/(m^2). General Appearance: Neat Eye Contact:  Fair Speech:  Normal Rate Volume:  Normal Mood:  Anxious, Depressed and Worthless Affect:  Depressed and Tearful Thought Process:  Coherent Orientation:  Full (Time, Place, and Person) Thought Content:  Rumination Suicidal Thoughts:  No Homicidal Thoughts:  No Memory:  Immediate;   Fair Recent;   Fair Remote;   Fair Judgement:  Good Insight:  Good Psychomotor Activity:  Normal Concentration:  Good Recall:  Good Fund of Knowledge: Good Language: Good Akathisia:  Negative Handed:  Right AIMS (if indicated):    Assets:  Communication Skills Desire for Improvement Resilience ADL's:  Intact Cognition: WNL Sleep:     Treatment Plan Summary: Admit  to inpatient unit for further evaluation and treatment   Observation Level/Precautions:  15 minute checks Laboratory:  see lab results Psychotherapy:  group Medications:  As per medlist Consultations:  As needed Discharge Concerns:  safety Estimated LOS:  5-7 days Other:    Janett Labella, NP [facility] 4/27/20173:25 PM

## 2015-12-28 NOTE — Progress Notes (Signed)
Patient transferred to [address] hall.

## 2015-12-28 NOTE — Plan of Care (Signed)
Problem: Consults Goal: Depression Patient Education See Patient Education Module for education specifics.  Outcome: Progressing Nurse discussed depression/coping skills with patient.        

## 2015-12-28 NOTE — Progress Notes (Signed)
Patient ID: Terri [name], female   DOB: Jan 29, 1996, 20 y.o.   MRN: [id number] D: Client has visitors earlier this shift. Client reports since the death of BF years ago. Client reports no grief counseling "I just tried to deal with it on my own" "If it wasn't for the grace of God I don't know how I would have made it" "it's so hard, my son looks just like him" A: Clinical research associateWriter provides emotional support, encouraged client to consider grief counseling. Medications reviewed and administered as ordered. Staff will monitor q7515min for safety. R: Client is safe on the unit, did not attend group.

## 2015-12-28 NOTE — BHH Counselor (Signed)
Pt would benefit from receiving OPT services [facility] along with medication management. Pt will be observed throughout the night for crises intervention, safety and stabilization. TTS to evaluate in am to aid in plans for disposition. Pt is currently denying SI thoughts. Pt states that she does not want to be admitted for inpatient services and that she would like to be discharged home to [facility].    Ardelle ParkLatoya McNeil, MA OBS Counselor

## 2015-12-28 NOTE — Progress Notes (Signed)
Admission Note:  Terri Stafford admitted to Observation bed 4 at 01:15.  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  Pt DX: MDD, Recurrent, Severe, without psychotic features.  [facility]  Pt is very quiet with almost inaudible speech.  Flat affect and mostly teary while answering assessment questions.  Pt has child two years ago with the father no longer living.  Pt also states school is major stressor.  Pt believes all her stressors have come together and she is having difficulty dealing with life.  Pt also sts she has not had a chance to grieve for her child's father as well. Pt deals with stress by withholding food and sleep as way to punish herself.  Pt is intelligent and cooperative.  Pt has good circle of friends at school who were not aware of pt's increasing stress, anxiety and depression.  Pt's child is being cared for by Pt's mom.  Pt is continuously observed for safety when on unit except when in bathroom.

## 2015-12-28 NOTE — Tx Team (Signed)
Initial Interdisciplinary Treatment Plan   PATIENT STRESSORS: Educational concerns Traumatic event   PATIENT STRENGTHS: Average or above average intelligence Capable of independent living General fund of knowledge Supportive family/friends   PROBLEM LIST: Problem List/Patient Goals Date to be addressed Date deferred Reason deferred Estimated date of resolution  "Overwhelmed with school" 12/28/2015     Suicidal ideation 12/28/2015     Unresolved grief 12/28/2015                                          DISCHARGE CRITERIA:  Improved stabilization in mood, thinking, and/or behavior Motivation to continue treatment in a less acute level of care Need for constant or close observation no longer present Reduction of life-threatening or endangering symptoms to within safe limits Verbal commitment to aftercare and medication compliance  PRELIMINARY DISCHARGE PLAN: Attend aftercare/continuing care group Outpatient therapy Return to previous living arrangement Return to previous work or school arrangements  PATIENT/FAMIILY INVOLVEMENT: This treatment plan has been presented to and reviewed with the patient, Terri Stafford.  The patient and family have been given the opportunity to ask questions and make suggestions.  Cranford MonBeaudry, [name] 12/28/2015, 3:20 PM

## 2015-12-28 NOTE — BHH Group Notes (Signed)
Pt did not attend karaoke group.  Coburn Knaus, MHT  

## 2015-12-28 NOTE — Progress Notes (Signed)
Pt presents with sad, flat affect. Reports being sleepy and has slept most of morning. Pt currently endorsing depression, but denies SI. Pt would like to be discharged home. Pt remains safe on unit with continuous observation except while in bathroom.

## 2015-12-29 ENCOUNTER — Encounter (HOSPITAL_COMMUNITY): Payer: Self-pay | Admitting: Psychiatry

## 2015-12-29 LAB — TSH: TSH: 1.097 u[IU]/mL (ref 0.350–4.500)

## 2015-12-29 MED ORDER — MIRTAZAPINE 7.5 MG PO TABS
7.5000 mg | ORAL_TABLET | Freq: Every day | ORAL | Status: DC
  Administered 2015-12-29: 7.5 mg via ORAL
  Filled 2015-12-29 (×3): qty 1

## 2015-12-29 NOTE — Tx Team (Signed)
Interdisciplinary Treatment Plan Update (Adult) Date: 12/29/2015    Time Reviewed: 9:30 AM  Progress in Treatment: Attending groups: Continuing to assess, patient new to milieu Participating in groups: Continuing to assess, patient new to milieu Taking medication as prescribed: Yes Tolerating medication: Yes Family/Significant other contact made: No, CSW assessing for appropriate contacts Patient understands diagnosis: Yes Discussing patient identified problems/goals with staff: Yes Medical problems stabilized or resolved: Yes Denies suicidal/homicidal ideation: Yes Issues/concerns per patient self-inventory: Yes Other:  New problem(s) identified: N/A  Discharge Plan or Barriers: Home with outpatient.  Reason for Continuation of Hospitalization:  Depression Anxiety Medication Stabilization   Comments: N/A  Estimated length of stay: 3-5 days    Patient is a 20 year old female who  presented to the hospital with depression. Pt reports primary trigger(s) for admission was family stressors and grief of child's father's death. Patient will benefit from crisis stabilization, medication evaluation, group therapy and psycho education in addition to case management for discharge planning. At discharge, it is recommended that Pt remain compliant with established discharge plan and continued treatment.   Review of initial/current patient goals per problem list:  1. Goal(s): Patient will participate in aftercare plan   Met: Yes   Target date: 3-5 days post admission date   As evidenced by: Patient will participate within aftercare plan AEB aftercare provider and housing plan at discharge being identified.  4/28: Goal met. Patient plans to return home to follow up with outpatient services.    2. Goal (s): Patient will exhibit decreased depressive symptoms and suicidal ideations.   Met: No   Target date: 3-5 days post admission date   As evidenced by: Patient will  utilize self rating of depression at 3 or below and demonstrate decreased signs of depression or be deemed stable for discharge by MD.  4/28: Goal not met: Pt presents with flat affect and depressed mood.  Pt admitted with depression rating of 10.  Pt to show decreased sign of depression and a rating of 3 or less before d/c.       3. Goal(s): Patient will demonstrate decreased signs and symptoms of anxiety.   Met: No   Target date: 3-5 days post admission date   As evidenced by: Patient will utilize self rating of anxiety at 3 or below and demonstrated decreased signs of anxiety, or be deemed stable for discharge by MD  4/28: Goal not met: Pt presents with anxious mood and affect.  Pt admitted with anxiety rating of 10.  Pt to show decreased sign of anxiety and a rating of 3 or less before d/c.    Attendees: Patient:    Family:    Physician: Dr. Parke Poisson; Dr. Sabra Heck 12/29/2015 9:30 AM  Nursing: Jacqualine Code, RN 12/29/2015 9:30 AM  Clinical Social Worker: Tilden Fossa, LCSW 12/29/2015 9:30 AM  Other: Maxie Better, LCSW  12/29/2015 9:30 AM  Other: Norberto Sorenson, P4CC 12/29/2015 9:30 AM  Other: Lars Pinks, Case Manager 12/29/2015 9:30 AM  Other: Agustina Caroli, May Augustin, NP 12/29/2015 9:30 AM  Other:    Other:         Scribe for Treatment Team:  Tilden Fossa, [place]

## 2015-12-29 NOTE — BHH Group Notes (Signed)
BHH LCSW Aftercare Discharge Planning Group Note  12/29/2015  8:45 AM  Participation Quality: Did Not Attend. Patient invited to participate but declined.  Donatello Kleve, MSW, LCSW Clinical Social Worker [facility] [phone]   

## 2015-12-29 NOTE — Progress Notes (Signed)
Terri Stafford is isolated to her room today. She didn't want to get OOB this morning and slept in. She refused her daily zoloft and requests it to be dc'Terri. A SHe completed her daily assessment and on it she wrote she denied  SI today and she rated her depression, hopelessness and anxiety  " 0/10/    ", respectively. She still remains upset about missing hr child. This afternoon she is seen by this nurse..influenza the dayroom...laughing with other patients. R Safety is in place.

## 2015-12-29 NOTE — BHH Suicide Risk Assessment (Signed)
B[facility] INPATIENT:  Family/Significant Other Suicide Prevention Education  Suicide Prevention Education:  Education Completed; mother Rosebud PolesMargaret Yamamoto [phone],  (name of family member/significant other) has been identified by the patient as the family member/significant other with whom the patient will be residing, and identified as the person(s) who will aid the patient in the event of a mental health crisis (suicidal ideations/suicide attempt).  With written consent from the patient, the family member/significant other has been provided the following suicide prevention education, prior to the and/or following the discharge of the patient.  The suicide prevention education provided includes the following:  Suicide risk factors  Suicide prevention and interventions  National Suicide Hotline telephone number  [facility] assessment telephone number  [facility] Emergency Assistance 911  [facility] and/or Residential Mobile Crisis Unit telephone number  Request made of family/significant other to:  Remove weapons (e.g., guns, rifles, knives), all items previously/currently identified as safety concern.    Remove drugs/medications (over-the-counter, prescriptions, illicit drugs), all items previously/currently identified as a safety concern.  The family member/significant other verbalizes understanding of the suicide prevention education information provided.  The family member/significant other agrees to remove the items of safety concern listed above.  Terri Stafford, Terri Stafford 12/29/2015, 5:47 PM

## 2015-12-29 NOTE — [facility] Suicide Risk Assessment (Addendum)
[facility]   Nursing information obtained from:   patient and chart  Demographic factors:   20 year old single female, college student  Current Mental Status:   see below  Loss Factors:   death of boyfriend 2 years ago, college stressors, son lives with patient's mother Historical Factors:   depression Risk Reduction Factors:   resilience, sense of responsibility to family/son  Total Time spent with patient: 45 minutes Principal Problem: MDD (major depressive disorder), recurrent episode, severe (HCC) Diagnosis:   Patient Active Problem List   Diagnosis Date Noted  . MDD (major depressive disorder), recurrent episode, severe (HCC) [F33.2] 12/28/2015  . MDD (major depressive disorder) (HCC) [F32.9] 12/28/2015     Continued Clinical Symptoms:  Alcohol Use Disorder Identification Test Final Score (AUDIT): 0 The "Alcohol Use Disorders Identification Test", Guidelines for Use in Primary Care, Second Edition.  World Science writerHealth Organization [facility](WHO). Score between 0-7:  no or low risk or alcohol related problems. Score between 8-15:  moderate risk of alcohol related problems. Score between 16-19:  high risk of alcohol related problems. Score 20 or above:  warrants further diagnostic evaluation for alcohol dependence and treatment.   CLINICAL FACTORS:  20 year old single female, college student, lives in dorm. States she has a 20 year old son, who lives with patient's mother in [place]. Her boyfriend, son's father, died in a MVA 2 years ago. Patient describes chronic depression, recently worsening, and a subjective sense she has been unable to adequately grieve and get closure regarding her boyfriend's death. Describes a sense of sadness, anhedonia, low energy level . In particular, states sleep has been poor and also describes decreased appetite . Presented voluntarily to ED due to worsening depression and emerging passive SI. No psychotic symptoms. Was not on any  psychiatric medications. Denies drug abuse, smokes cannabis regularly, denies other drug abuse Medical history remarkable for history of Asthma and Juvenile Arthritis  Dx- MDD, no psychotic features  Plan - inpatient admission- we discussed options,  Not comfortable taking SSRI - had been started on low dose Zoloft, but agrees to REMERON for insomnia, depression- side effects discussed. At patient's express request I contacted her mother to review her admission , plan .     Musculoskeletal: Strength & Muscle Tone: within normal limits Gait & Station: normal Patient leans: N/A  Psychiatric Specialty Exam: ROS no current chest pain, no current shortness of breath  Blood pressure 105/72, pulse 74, temperature 97.4 F (36.3 C), temperature source Oral, resp. rate 20, height 5\' 2"  (1.575 m), weight 148 lb (67.132 kg), last menstrual period 12/20/2015, SpO2 100 %.Body mass index is 27.06 kg/(m^2).  General Appearance: Fairly Groomed  Patent attorneyye Contact::  Good  Speech:  Normal Rate  Volume:  Decreased  Mood:  Depressed  Affect:  Constricted  Thought Process:  Linear  Orientation:  Full (Time, Place, and Person)  Thought Content:  denies hallucinations, no delusions, ruminative about loss of boyfriend as above   Suicidal Thoughts:  Yes.  without intent/plan- denies any current plan or intention of suicide or of hurting self and contracts for safety on the unit   Homicidal Thoughts:  No  Memory:  recent and remote grossly intact   Judgement:  Fair  Insight:  Fair  Psychomotor Activity:  Normal  Concentration:  Good  Recall:  Good  Fund of Knowledge:Good  Language: Good  Akathisia:  Negative  Handed:  Right  AIMS (if indicated):     Assets:  Communication Skills Desire for Improvement Vocational/Educational  Sleep:  Number of Hours: 4.5  Cognition: WNL  ADL's:  Intact    COGNITIVE FEATURES THAT CONTRIBUTE TO RISK:  Loss of executive function    SUICIDE RISK:   Moderate:  Frequent  suicidal ideation with limited intensity, and duration, some specificity in terms of plans, no associated intent, good self-control, limited dysphoria/symptomatology, some risk factors present, and identifiable protective factors, including available and accessible social support.  PLAN OF CARE: Patient will be admitted to inpatient psychiatric unit for stabilization and safety. Will provide and encourage milieu participation. Provide medication management and maked adjustments as needed.  Will follow daily.    I certify that inpatient services furnished can reasonably be expected to improve the patient's condition.   Nehemiah Massed, MD 12/29/2015, 12:47 PM

## 2015-12-29 NOTE — Progress Notes (Signed)
NUTRITION ASSESSMENT  Pt identified as at risk on the Malnutrition Screen Tool  INTERVENTION: 1. Recommend referral to outpatient RD for disordered eating patterns. 2. Supplements: Continue Ensure Enlive po BID, each supplement provides 350 kcal and 20 grams of protein  NUTRITION DIAGNOSIS: Unintentional weight loss related to sub-optimal intake as evidenced by pt report.   Goal: Pt to meet >/= 90% of their estimated nutrition needs.  Monitor:  PO intake  Assessment:  Pt admitted with depression. Pt reports starving herself as a way of punishing herself. Pt with no weight loss per history. Pt had a "dizzy spell" this morning per RN notes. Question PO intake. Pt has been ordered Ensure supplements, will continue supplement.  If patient continues restrictive eating behavior, recommend referral to outpatient RD to address disordered eating patterns.  Height: Ht Readings from Last 1 Encounters:  12/28/15 5\' 2"  (1.575 m) (18 %*, Z = -0.90)   * Growth percentiles are based on CDC 2-20 Years data.    Weight: Wt Readings from Last 1 Encounters:  12/28/15 148 lb (67.132 kg) (78 %*, Z = 0.77)   * Growth percentiles are based on CDC 2-20 Years data.    Weight Hx: Wt Readings from Last 10 Encounters:  12/28/15 148 lb (67.132 kg) (78 %*, Z = 0.77)  12/27/15 149 lb 6 oz (67.756 kg) (79 %*, Z = 0.82)   * Growth percentiles are based on CDC 2-20 Years data.    BMI:  Body mass index is 27.06 kg/(m^2). Pt meets criteria for overweight based on current BMI.  Estimated Nutritional Needs: Kcal: 25-30 kcal/kg Protein: > 1 gram protein/kg Fluid: 1 ml/kcal  Diet Order: Diet regular Room service appropriate?: Yes; Fluid consistency:: Thin Pt is also offered choice of unit snacks mid-morning and mid-afternoon.  Pt is eating as desired.   Lab results and medications reviewed.   Tilda FrancoLindsey Monette Omara, MS, RD, LDN Pager: [phone] After Hours Pager: [phone]

## 2015-12-29 NOTE — Progress Notes (Signed)
D: Client found on the floor, reports falling coming from the BR "I got dizzy" Client reports discomfort in her left arm. Client reports she put arm out to break the fall. Client rates pain as "6" of 10. A:  VS taken, client assessed for bruises, or skin tears, none noted. Donia AstBrook M. RN, [facility] notified, on provider Leonette Mostharles. K. PA notified. Post Fall assessment initiated,client refused to have family notified. Staff reviewed fall safety plan, client encouraged to notify staff when going to BR, also reviewed the effects of medications. Staff will continue to monitor q8415min for safety and retake VS in two hours. Client refuse medication for pain, hot pack given.  R: Client back in bed.

## 2015-12-29 NOTE — [facility] Group Notes (Signed)
B[facility] LCSW Group Therapy 12/29/2015  1:15 PM   Type of Therapy: Group Therapy  Participation Level: Did Not Attend. Patient invited to participate but declined.   Elesia Pemberton, MSW, LCSW Clinical Social Worker [facility] [phone]   

## 2015-12-29 NOTE — H&P (Signed)
[facility] Adult Inpatient Unit Admission H&P  Patient Identification: Terri Stafford MRN:  [id number] Date of Evaluation:  12/29/2015 Chief Complaint:  MDD with Psych Features Principal Diagnosis: MDD (major depressive disorder), recurrent episode, severe (HCC) Diagnosis:   Patient Active Problem List   Diagnosis Date Noted  . MDD (major depressive disorder), recurrent episode, severe (HCC) [F33.2] 12/28/2015  . MDD (major depressive disorder) (HCC) [F32.9] 12/28/2015   History of Present Illness: Terri Stafford is an 20 y.o. female. Patient presents to to [facility] for a TTS assessment. She was referred by her therapist at [facility] counseling center. Patient reports increased depression. Her depression is triggered by guilt. She is the mother of a 14 yr old boy. She left her son in [place], [place] to be raised by her family so that she is able to finish school. Patient feels bad that she is not around to raise her son. She is in her 3rd semester of college. Patient reports depressive symptoms of hopelessness, isolating self from others, crying spells, and fatigue. Patient is also grieving. Sts that the father of her son passed away 07-28-2014. Patient has suicidal thoughts, no plan, no intent. She denies history of suicide attempts. She denies self mutilating behaviors. Patient however starves herself or refused to sleep as a form of punishing herself. She denies HI and AVH's. She is calm and cooperative. She reports regular use of THC.   Associated Signs/Symptoms: Depression Symptoms:  depressed mood, anxiety, (Hypo) Manic Symptoms:  Labiality of Mood, Anxiety Symptoms:  Excessive Worry, Psychotic Symptoms:  NA PTSD Symptoms: NA Total Time spent with patient: 45 minutes  Past Psychiatric History: see above noted  Is the patient at risk to self? Yes.    Has the patient been a risk to self in the past 6 months? Yes.    Has the patient been a risk to self within the distant past? Yes.    Is the patient a  risk to others? No.  Has the patient been a risk to others in the past 6 months? No.  Has the patient been a risk to others within the distant past? No.   Prior Inpatient Therapy:   Prior Outpatient Therapy:    Alcohol Screening: 1. How often do you have a drink containing alcohol?: Never 9. Have you or someone else been injured as a result of your drinking?: No 10. Has a relative or friend or a doctor or another health worker been concerned about your drinking or suggested you cut down?: No Alcohol Use Disorder Identification Test Final Score (AUDIT): 0 Brief Intervention: AUDIT score less than 7 or less-screening does not suggest unhealthy drinking-brief intervention not indicated Substance Abuse History in the last 12 months:  No. Consequences of Substance Abuse: NA Previous Psychotropic Medications: Yes  Psychological Evaluations: Yes  Past Medical History:  Past Medical History  Diagnosis Date  . Asthma   . Arthritis     Past Surgical History  Procedure Laterality Date  . Tonsillectomy     Family History: History reviewed. No pertinent family history. Family Psychiatric History: Denies Tobacco Screening: ([id number])::1)@ Social History:  History  Alcohol Use No     History  Drug Use No    Additional Social History:      Pain Medications: SEE MAR Prescriptions: SEE MAR Over the Counter: SEE MAR History of alcohol / drug use?: Yes Name of Substance 1: THC 1 - Age of First Use: 20 yrs old  1 - Amount (size/oz): varies 1 - Frequency:  daily  1 - Duration: on-going 1 - Last Use / Amount: 2am this morning  Allergies:   Allergies  Allergen Reactions  . Penicillins     Unknown   Lab Results:  Results for orders placed or performed during the hospital encounter of 12/28/15 (from the past 48 hour(s))  TSH     Status: None   Collection Time: 12/29/15  6:27 AM  Result Value Ref Range   TSH 1.097 0.350 - 4.500 uIU/mL    Comment: Performed at [facility]    Blood Alcohol level:  Lab Results  Component Value Date   [facility] <5 12/27/2015    Metabolic Disorder Labs:  No results found for: HGBA1C, MPG No results found for: PROLACTIN No results found for: CHOL, TRIG, HDL, CHOLHDL, VLDL, LDLCALC  Current Medications: Current Facility-Administered Medications  Medication Dose Route Frequency Provider Last Rate Last Dose  . acetaminophen (TYLENOL) tablet 650 mg  650 mg Oral Q6H PRN Adonis Brook, NP      . albuterol (PROVENTIL HFA;VENTOLIN HFA) 108 (90 Base) MCG/ACT inhaler 1-2 puff  1-2 puff Inhalation Q4H PRN Kerry Hough, PA-C   2 puff at 12/28/15 0318  . alum & mag hydroxide-simeth (MAALOX/MYLANTA) 200-200-20 MG/5ML suspension 30 mL  30 mL Oral Q4H PRN Adonis Brook, NP      . feeding supplement (ENSURE ENLIVE) (ENSURE ENLIVE) liquid 237 mL  237 mL Oral BID BM Adonis Brook, NP   237 mL at 12/28/15 1449  . hydrOXYzine (ATARAX/VISTARIL) tablet 25 mg  25 mg Oral Q6H PRN Kerry Hough, PA-C   25 mg at 12/28/15 0220  . magnesium hydroxide (MILK OF MAGNESIA) suspension 30 mL  30 mL Oral Daily PRN Adonis Brook, NP      . mirtazapine (REMERON) tablet 7.5 mg  7.5 mg Oral QHS Craige Cotta, MD       PTA Medications: Prescriptions prior to admission  Medication Sig Dispense Refill Last Dose  . albuterol (PROVENTIL HFA;VENTOLIN HFA) 108 (90 BASE) MCG/ACT inhaler Inhale 1-2 puffs into the lungs every 4 (four) hours as needed for wheezing or shortness of breath. 1 Inhaler 0 unknown  . predniSONE (DELTASONE) 50 MG tablet Take once a day for 5 days (Patient not taking: Reported on 12/27/2015) 5 tablet 0 Not Taking at Unknown time    Musculoskeletal: Strength & Muscle Tone: within normal limits Gait & Station: normal Patient leans: Right and N/A  Psychiatric Specialty Exam: Physical Exam  Vitals reviewed. Psychiatric: Her mood appears anxious. Her affect is labile. She is withdrawn. She exhibits a depressed mood.    Review of Systems  Psychiatric/Behavioral: Positive for depression. Negative for hallucinations. The patient is nervous/anxious.    Blood pressure 105/72, pulse 74, temperature 97.4 F (36.3 C), temperature source Oral, resp. rate 20, height  (1.575 m), weight 67.132 kg (148 lb), last menstrual period 12/20/2015, SpO2 100 %.Body mass index is 27.06 kg/(m^2). General Appearance: Neat Eye Contact:  Fair Speech:  Normal Rate Volume:  Normal Mood:  Anxious, Depressed and Worthless Affect:  Depressed and Tearful Thought Process:  Coherent Orientation:  Full (Time, Place, and Person) Thought Content:  Rumination Suicidal Thoughts:  No Homicidal Thoughts:  No Memory:  Immediate;   Fair Recent;   Fair Remote;   Fair Judgement:  Good Insight:  Good Psychomotor Activity:  Normal Concentration:  Good Recall:  Good Fund of Knowledge: Good Language: Good Akathisia:  Negative Handed:  Right AIMS (if indicated):  Assets:  Communication Skills Desire for Improvement Resilience ADL's:  Intact Cognition: WNL Sleep:  Number of Hours: 4.5  Treatment Plan Summary: Admit to inpatient unit for further evaluation and treatment   Observation Level/Precautions:  15 minute checks Laboratory:  see lab results Psychotherapy:  group Medications:  As per medlist Consultations:  As needed Discharge Concerns:  safety Estimated LOS:  5-7 days Other:    Lindwood QuaSheila May Agustin, NP [facility] 4/28/20172:34 PM Case reviewed with NP , and patient seen by me  Agree with NP and assessment as above  20 year old single female, college student, lives in dorm. States she has a 207 year old son, who lives with patient's mother in [place]. Her boyfriend, son's father, died in a MVA 2 years ago. Patient describes chronic depression, recently worsening, and a subjective sense she has been unable to adequately grieve and get closure regarding her boyfriend's death. Describes a sense of sadness, anhedonia, low  energy level . In particular, states sleep has been poor and also describes decreased appetite . Presented voluntarily to ED due to worsening depression and emerging passive SI. No psychotic symptoms. Was not on any psychiatric medications. Denies drug abuse, smokes cannabis regularly, denies other drug abuse Medical history remarkable for history of Asthma and Juvenile Arthritis  Dx- MDD, no psychotic features  Plan - inpatient admission- we discussed options, Not comfortable taking SSRI - had been started on low dose Zoloft, but agrees to REMERON for insomnia, depression- side effects discussed. At patient's express request I contacted her mother to review her admission , plan .

## 2015-12-30 DIAGNOSIS — F332 Major depressive disorder, recurrent severe without psychotic features: Secondary | ICD-10-CM | POA: Insufficient documentation

## 2015-12-30 MED ORDER — CETAPHIL MOISTURIZING EX LOTN
TOPICAL_LOTION | Freq: Two times a day (BID) | CUTANEOUS | Status: DC
  Filled 2015-12-30 (×3): qty 473

## 2015-12-30 MED ORDER — MIRTAZAPINE 15 MG PO TABS
15.0000 mg | ORAL_TABLET | Freq: Every day | ORAL | Status: DC
  Administered 2015-12-30 – 2015-12-31 (×2): 15 mg via ORAL
  Filled 2015-12-30 (×2): qty 1
  Filled 2015-12-30: qty 7
  Filled 2015-12-30: qty 1

## 2015-12-30 NOTE — [facility] Group Notes (Signed)
[facility] LCSW Group Therapy  12/30/2015 / 10:15 - 11:15 AM  Type of Therapy:  Group Therapy  Participation Level:  Active  Participation Quality:  Attentive and Sharing  Affect:  Appropriate  Cognitive:  Alert and Oriented  Insight:  Improving  Engagement in Therapy: Improving  Modes of Intervention:  Discussion, Exploration, Rapport Building, Socialization and Support  Summary of Progress/Problems:   Summary of Progress/Problems: The main focus of today's process group was for the patient to identify ways in which they have in the past sabotaged their own recovery. Motivational Interviewing was utilized to ask the group members what they get out of their self sabotaging behavior(s), and what reasons they may have for wanting to change. The Stages of Change were explained using a handout, and patients identified where they currently are with regard to stages of change. Patient processed her feelings related to involuntary commitment and reported she is on contemplation stage. She is uncertain what will be needed to make a decision to chang.       Terri Stafford, Terri Stafford

## 2015-12-30 NOTE — Progress Notes (Signed)
D Terri Stafford is seen OOB UAL on the 400 hall today. She is seen smiling and laughing, as she speaks with another patient. She completed her daily assessment and on it she wrote she denied SI today and she rated her depression, hopelessness and anxiety " 0/0/0/", respectively.

## 2015-12-30 NOTE — Progress Notes (Addendum)
[facility] MD Progress Note  12/30/2015 1:01 PM Terri Stafford  MRN:  [id number] Subjective:  Patient smiled and stated that she was much better.  "I feel that I needed a break.  I am learning coping skills in groups."  Patient in fact is smiling and her flat affect from previous day has turned more expressive.   Objective:  Alan Mulder came initially to OBS for worsening depression.  This NP had seen her in OBS unit and she was tearful then.  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  She is reluctant to take any meds.  She is agreeable with Restoril for sleep aid and depression.    Principal Problem: MDD (major depressive disorder), recurrent episode, severe (HCC) Diagnosis:   Patient Active Problem List   Diagnosis Date Noted  . MDD (major depressive disorder), recurrent episode, severe (HCC) [F33.2] 12/28/2015  . MDD (major depressive disorder) (HCC) [F32.9] 12/28/2015   Total Time spent with patient: 30 minutes  Past Psychiatric History: see above noted  Past Medical History:  Past Medical History  Diagnosis Date  . Asthma   . Arthritis     Past Surgical History  Procedure Laterality Date  . Tonsillectomy     Family History: History reviewed. No pertinent family history. Family Psychiatric  History:  Denied Social History:  History  Alcohol Use No     History  Drug Use No    Social History   Social History  . Marital Status: Single    Spouse Name: N/A  . Number of Children: N/A  . Years of Education: N/A   Social History Main Topics  . Smoking status: Never Smoker   . Smokeless tobacco: Never Used  . Alcohol Use: No  . Drug Use: No  . Sexual Activity: Yes    Birth Control/ Protection: None   Other Topics Concern  . None   Social History Narrative   Additional Social History:    Pain Medications: SEE MAR Prescriptions: SEE MAR Over the Counter: SEE MAR History of alcohol / drug use?: Yes Name of  Substance 1: THC 1 - Age of First Use: 20 yrs old  1 - Amount (size/oz): varies 1 - Frequency: daily  1 - Duration: on-going 1 - Last Use / Amount: 2am this morning      Sleep: Good  Appetite:  Good  Current Medications: Current Facility-Administered Medications  Medication Dose Route Frequency Provider Last Rate Last Dose  . acetaminophen (TYLENOL) tablet 650 mg  650 mg Oral Q6H PRN Adonis Brook, NP      . albuterol (PROVENTIL HFA;VENTOLIN HFA) 108 (90 Base) MCG/ACT inhaler 1-2 puff  1-2 puff Inhalation Q4H PRN Kerry Hough, PA-C   2 puff at 12/28/15 0318  . alum & mag hydroxide-simeth (MAALOX/MYLANTA) 200-200-20 MG/5ML suspension 30 mL  30 mL Oral Q4H PRN Adonis Brook, NP      . feeding supplement (ENSURE ENLIVE) (ENSURE ENLIVE) liquid 237 mL  237 mL Oral BID BM Adonis Brook, NP   237 mL at 12/28/15 1449  . hydrOXYzine (ATARAX/VISTARIL) tablet 25 mg  25 mg Oral Q6H PRN Kerry Hough, PA-C   25 mg at 12/28/15 0220  . magnesium hydroxide (MILK OF MAGNESIA) suspension 30 mL  30 mL Oral Daily PRN Adonis Brook, NP      . mirtazapine (REMERON) tablet 15 mg  15 mg Oral QHS Adonis Brook, NP  Lab Results:  Results for orders placed or performed during the hospital encounter of 12/28/15 (from the past 48 hour(s))  TSH     Status: None   Collection Time: 12/29/15  6:27 AM  Result Value Ref Range   TSH 1.097 0.350 - 4.500 uIU/mL    Comment[facility]    Blood Alcohol level:  Lab Results  Component Value Date   [facility] 12/27/2015    Physical Findings: AIMS:  , ,  ,  ,    CIWA:    COWS:  COWS Total Score: 1  Musculoskeletal: Strength & Muscle Tone: within normal limits Gait & Station: normal Patient leans: N/A  Psychiatric Specialty Exam: Review of Systems  Neurological: Loss of consciousness: improving.  Psychiatric/Behavioral: Positive for depression. Negative for suicidal ideas and hallucinations.  All other systems  reviewed and are negative.   Blood pressure 120/77, pulse 85, temperature 97.7 F (36.5 C), temperature source Oral, resp. rate 15, height 5\' 2"  (1.575 m), weight 67.132 kg (148 lb), last menstrual period 12/20/2015, SpO2 100 %.Body mass index is 27.06 kg/(m^2).  General Appearance: Neat Eye Contact: Fair Speech: Normal Rate Volume: Normal Mood: Anxious, Depressed and Worthless Affect: Depressed and Tearful Thought Process: Coherent Orientation: Full (Time, Place, and Person) Thought Content: Rumination Suicidal Thoughts: No Homicidal Thoughts: No Memory: Immediate; Fair Recent; Fair Remote; Fair Judgement: Good Insight: Good Psychomotor Activity: Normal Concentration: Good Recall: Good Fund of Knowledge: Good Language: Good Akathisia: Negative Handed: Right AIMS (if indicated):   Assets: Communication Skills Desire for Improvement Resilience ADL's: Intact Cognition: WNL Sleep: Number of Hours: 4.5  Treatment Plan Summary: Admit for crisis management and mood stabilization. Medication management to re-stabilize current mood symptoms.  Remeron 15 mg QHS Group counseling sessions for coping skills Medical consults as needed Review and reinstate any pertinent home medications for other health problems  Terri QuaSheila May Mitzi Lilja, NP [facility] 12/30/2015, 1:01 PM

## 2015-12-30 NOTE — Progress Notes (Signed)
Patient ID: Terri [name], female   DOB: May 08, 1996, 20 y.o.   MRN: [id number] D: Client visible on milieu tonight, reports "feel better today, didn't cry when they left" "I cried one time today that's when I talked to my mom on the phone" A: Writer encouraged client to continue to come out of room attend groups and interacts with peers and staff. Writer reviewed medication, administered as ordered. Safety plan also reviewed, client to call staff if dizzy, but try to use bathroom and shower prior to medication admin. Staff will monitor q4515min for safety. R:Client is safe on the unit, attended group.

## 2015-12-31 MED ORDER — CAMPHOR-MENTHOL 0.5-0.5 % EX LOTN
TOPICAL_LOTION | Freq: Two times a day (BID) | CUTANEOUS | Status: DC
  Administered 2015-12-31 – 2016-01-01 (×3): via TOPICAL
  Filled 2015-12-31: qty 222

## 2015-12-31 NOTE — [facility] Group Notes (Signed)
[facility] LCSW Group Therapy  12/31/2015 10:00 PM  Type of Therapy:  Group Therapy  Participation Level:  Minimal  Participation Quality:  Appropriate, Attentive, Sharing and Supportive  Affect:  Appropriate  Cognitive:  Alert, Appropriate and Oriented  Insight:  Developing/Improving  Engagement in Therapy:  Developing/Improving  Modes of Intervention:  Discussion  Summary of Progress/Problems: Participants discussed challenges of poor self-esteem and asked questions to gain better understanding of the issue. Group discussion processed roots of self-esteem in order to identify a plan to work on self-esteem. Identify self-esteem and how it is influenced of others. Acknowledge the importance of being honest with yourself about feelings and self-worth.  Identify supports that will affirm esteem. Identify activities for acceptance of self. Also, began to discuss the process of loss in relationships during the search for additional supports. Patient was very expressive and supportive of her peers for treatment. Patient was able to discuss expanding her supports to include her mom.   Beverly SessionsLINDSEY, Joyce Leckey J 12/31/2015, 12:40 PM

## 2015-12-31 NOTE — Progress Notes (Signed)
Patient has been up and active on the unit, attended group this evening and has voiced no complaints. She is hopeful to discharge on tomorrow. She reports that her family is her support system and she plans to use them more often. Patient currently denies having pain, -si/hi/a/v hall. Support and encouragement offered, safety maintained on unit, will continue to monitor.

## 2015-12-31 NOTE — Progress Notes (Signed)
Adult Psychoeducational Group Note  Date:  12/31/2015 Time:  9:26 PM  Group Topic/Focus:  Wrap-Up Group:   The focus of this group is to help patients review their daily goal of treatment and discuss progress on daily workbooks.  Participation Level:  Active  Participation Quality:  Appropriate  Affect:  Appropriate  Cognitive:  Alert and Appropriate  Insight: Appropriate  Engagement in Group:  Engaged  Modes of Intervention:  Discussion, Education and Support  Additional Comments:  Pt rated her day at a 10 out of 10. Pt had a very positive day because she was able to see her mother and her son who she had not seen in a couple of weeks.  Malachy MoanJeffers, Terri Stafford 12/31/2015, 9:26 PM

## 2015-12-31 NOTE — Progress Notes (Signed)
Patient ID: Terri Stafford, female   DOB: [birth year], 20 y.o.   MRN: [id number] D: Client visible on the unit, interacting more, also has visitors this evening, reports "great" day. Client smiling and seen on the phone. "My mom ride didn't come, but she said she will definitely be here tomorrow" Client reports she is looking forward to seeing her son. "I been out of my room all day"  "been to groups" A: Clinical research associateWriter commended client for effort in staying up and interacting on milieu. Medication reviewed, administered as ordered. Staff will monitor q8615min for safety. R:Client is safe on the unit.

## 2015-12-31 NOTE — Progress Notes (Signed)
[facility]  12/31/2015 3:08 PM Terri Stafford  MRN:  [id number] Subjective:  Patient smiled and stated that she was much better.  She is anticipating that she will be discharged tomorrow. Objective:  Alan Mulderedera [place] came initially to OBS for worsening depression.  She is a [employer] student and she was prescribed antidepressants years ago and did not take.  She is reluctant to take any meds.  She is agreeable with Restoril for sleep aid and depression.   This NP had seen her in OBS unit and she was tearful then.      Principal Problem: MDD (major depressive disorder), recurrent episode, severe (HCC) Diagnosis:   Patient Active Problem List   Diagnosis Date Noted  . Severe episode of recurrent major depressive disorder, without psychotic features (HCC) [F33.2]   . MDD (major depressive disorder), recurrent episode, severe (HCC) [F33.2] 12/28/2015  . MDD (major depressive disorder) (HCC) [F32.9] 12/28/2015   Total Time spent with patient: 30 minutes  Past Psychiatric History: see above noted  Past Medical History:  Past Medical History  Diagnosis Date  . Asthma   . Arthritis     Past Surgical History  Procedure Laterality Date  . Tonsillectomy     Family History: History reviewed. No pertinent family history. Family Psychiatric  History:  Denied Social History:  History  Alcohol Use No     History  Drug Use No    Social History   Social History  . Marital Status: Single    Spouse Name: N/A  . Number of Children: N/A  . Years of Education: N/A   Social History Main Topics  . Smoking status: Never Smoker   . Smokeless tobacco: Never Used  . Alcohol Use: No  . Drug Use: No  . Sexual Activity: Yes    Birth Control/ Protection: None   Other Topics Concern  . None   Social History Narrative   Additional Social History:    Pain Medications: SEE MAR Prescriptions: SEE MAR Over the Counter: SEE MAR History of alcohol / drug use?: Yes Name of Substance 1:  THC 1 - Age of First Use: 20 yrs old  1 - Amount (size/oz): varies 1 - Frequency: daily  1 - Duration: on-going 1 - Last Use / Amount: 2am this morning      Sleep: Good  Appetite:  Good  Current Medications: Current Facility-Administered Medications  Medication Dose Route Frequency Provider Last Rate Last Dose  . acetaminophen (TYLENOL) tablet 650 mg  650 mg Oral Q6H PRN Adonis BrookSheila Emmaline Wahba, NP      . albuterol (PROVENTIL HFA;VENTOLIN HFA) 108 (90 Base) MCG/ACT inhaler 1-2 puff  1-2 puff Inhalation Q4H PRN Kerry HoughSpencer E Simon, PA-C   2 puff at 12/28/15 0318  . alum & mag hydroxide-simeth (MAALOX/MYLANTA) 200-200-20 MG/5ML suspension 30 mL  30 mL Oral Q4H PRN Adonis BrookSheila Joanette Silveria, NP      . camphor-menthol [facility](SARNA) lotion   Topical BID Otho Bellowserri L Green, RPH      . feeding supplement (ENSURE ENLIVE) (ENSURE ENLIVE) liquid 237 mL  237 mL Oral BID BM Adonis BrookSheila Ellen Goris, NP   237 mL at 12/28/15 1449  . hydrOXYzine (ATARAX/VISTARIL) tablet 25 mg  25 mg Oral Q6H PRN Kerry HoughSpencer E Simon, PA-C   25 mg at 12/28/15 0220  . magnesium hydroxide (MILK OF MAGNESIA) suspension 30 mL  30 mL Oral Daily PRN Adonis BrookSheila Mckenlee Mangham, NP      . mirtazapine (REMERON) tablet 15 mg  15 mg Oral QHS  Adonis Brook, NP   15 mg at 12/30/15 2301    Lab Results:  No results found for this or any previous visit (from the past 48 hour(s)).  Blood Alcohol level:  Lab Results  Component Value Date   ETH <5 12/27/2015    Physical Findings: AIMS:  , ,  ,  ,    CIWA:    COWS:  COWS Total Score: 1  Musculoskeletal: Strength & Muscle Tone: within normal limits Gait & Station: normal Patient leans: N/A  Psychiatric Specialty Exam: Review of Systems  Neurological: Loss of consciousness: improving.  Psychiatric/Behavioral: Positive for depression. Negative for suicidal ideas and hallucinations.  All other systems reviewed and are negative.   Blood pressure 104/70, pulse 72, temperature 97.8 F (36.6 C), temperature source Oral, resp. rate 16,  height  (1.575 m), weight 67.132 kg (148 lb), last menstrual period 12/20/2015, SpO2 100 %.Body mass index is 27.06 kg/(m^2).  General Appearance: Neat Eye Contact: Fair Speech: Normal Rate Volume: Normal Mood: Anxious, Depressed and Worthless Affect: Depressed and Tearful Thought Process: Coherent Orientation: Full (Time, Place, and Person) Thought Content: Rumination Suicidal Thoughts: No Homicidal Thoughts: No Memory: Immediate; Fair Recent; Fair Remote; Fair Judgement: Good Insight: Good Psychomotor Activity: Normal Concentration: Good Recall: Good Fund of Knowledge: Good Language: Good Akathisia: Negative Handed: Right AIMS (if indicated):   Assets: Communication Skills Desire for Improvement Resilience ADL's: Intact Cognition: WNL Sleep: Number of Hours: 4.5  Treatment Plan Summary: Admit for crisis management and mood stabilization. Medication management to re-stabilize current mood symptoms.  Remeron 15 mg QHS continue Group counseling sessions for coping skills Medical consults as needed Review and reinstate any pertinent home medications for other health problems  Lindwood Qua, NP [facility] 12/31/2015, 3:08 PM

## 2015-12-31 NOTE — Progress Notes (Signed)
Terri Stafford is seen out in the milieu...she tolerates this well...she is seen sitting in the dayroom most of the day..talking with he peers..talking on the phone.[name].[name].She laughs at this nurse and says " I guess so" when this nurse asks her if she is getting ready to go home anytime soon. She completes her daily assessment and on it she wrote she deneid SI today and she rated her depression, hopelessness and anxiety  " 0/0/0", respectively. A She says " I am beginning to understand what this is about...". R Safety is in place. Pt offered support. And safety in place.

## 2016-01-01 MED ORDER — CAMPHOR-MENTHOL 0.5-0.5 % EX LOTN: TOPICAL_LOTION | Freq: Two times a day (BID) | CUTANEOUS | Status: DC

## 2016-01-01 MED ORDER — HYDROXYZINE HCL 25 MG PO TABS: 25.0000 mg | ORAL_TABLET | Freq: Four times a day (QID) | ORAL | Status: DC | PRN

## 2016-01-01 MED ORDER — MIRTAZAPINE 15 MG PO TABS: 15.0000 mg | ORAL_TABLET | Freq: Every day | ORAL | Status: DC

## 2016-01-01 NOTE — Plan of Care (Signed)
Problem: Diagnosis: Increased Risk For Suicide Attempt Goal: STG-Patient Will Attend All Groups On The Unit Outcome: Progressing Patient attended evening group and participated.     

## 2016-01-01 NOTE — Plan of Care (Signed)
Problem: Diagnosis: Increased Risk For Suicide Attempt Goal: STG-Patient Will Comply With Medication Regime Outcome: Progressing Patient is compliant with scheduled medication.     

## 2016-01-01 NOTE — Tx Team (Signed)
Interdisciplinary Treatment Plan Update (Adult) Date: 01/01/2016    Time Reviewed: 9:30 AM  Progress in Treatment: Attending groups: Infrequently Participating in groups: Yes, when she attends Taking medication as prescribed: Yes Tolerating medication: Yes Family/Significant other contact made: Yes, CSW has spoken with patient's mother Patient understands diagnosis: Yes Discussing patient identified problems/goals with staff: Yes Medical problems stabilized or resolved: Yes Denies suicidal/homicidal ideation: Yes Issues/concerns per patient self-inventory: Yes Other:  New problem(s) identified: N/A  Discharge Plan or Barriers: Home with outpatient.  Reason for Continuation of Hospitalization:  Depression Anxiety Medication Stabilization   Comments: N/A  Estimated length of stay: Discharge anticipated for today 01/01/16    Patient is a 20 year old female who  presented to the hospital with depression. Pt reports primary trigger(s) for admission was family stressors and grief of child's father's death. Patient will benefit from crisis stabilization, medication evaluation, group therapy and psycho education in addition to case management for discharge planning. At discharge, it is recommended that Pt remain compliant with established discharge plan and continued treatment.   Review of initial/current patient goals per problem list:  1. Goal(s): Patient will participate in aftercare plan   Met: Yes   Target date: 3-5 days post admission date   As evidenced by: Patient will participate within aftercare plan AEB aftercare provider and housing plan at discharge being identified.  4/28: Goal met. Patient plans to return home to follow up with outpatient services.    2. Goal (s): Patient will exhibit decreased depressive symptoms and suicidal ideations.   Met: Yes   Target date: 3-5 days post admission date   As evidenced by: Patient will utilize self rating of  depression at 3 or below and demonstrate decreased signs of depression or be deemed stable for discharge by MD.  4/28: Goal not met: Pt presents with flat affect and depressed mood.  Pt admitted with depression rating of 10.  Pt to show decreased sign of depression and a rating of 3 or less before d/c.    4/30: Goal met. Patient rates depression at 0, denies SI.     3. Goal(s): Patient will demonstrate decreased signs and symptoms of anxiety.   Met: Yes   Target date: 3-5 days post admission date   As evidenced by: Patient will utilize self rating of anxiety at 3 or below and demonstrated decreased signs of anxiety, or be deemed stable for discharge by MD  4/28: Goal not met: Pt presents with anxious mood and affect.  Pt admitted with anxiety rating of 10.  Pt to show decreased sign of anxiety and a rating of 3 or less before d/c.  4/30: Goal met. Patient rates anxiety at 0.   Attendees: Patient:    Family:    Physician: Dr. Parke Poisson 01/01/2016 9:30 AM  Nursing: Kerby Nora, Marcella Dubs, RN 01/01/2016 9:30 AM  Clinical Social Worker: Tilden Fossa, LCSW 01/01/2016 9:30 AM  Other:  01/01/2016 9:30 AM  Other: Norberto Sorenson, P4CC 01/01/2016 9:30 AM  Other: Lars Pinks, Case Manager 01/01/2016 9:30 AM  Other: Agustina Caroli, May Augustin, NP 01/01/2016 9:30 AM  Other:              Scribe for Treatment Team:  Tilden Fossa, [place]

## 2016-01-01 NOTE — Discharge Summary (Signed)
Physician Discharge Summary Note  Patient:  Terri Stafford is an 20 y.o., female MRN:  [id number] DOB:  29-Jan-1996 Patient phone:  [phone] (home)  Patient address:   [address] [place] [address],  Total Time spent with patient: 30 minutes  Date of Admission:  12/28/2015 Date of Discharge: 01/01/2016  Reason for Admission:PER H&P-Terri Stafford is an 20 y.o. female. Patient presents to to [facility] for a TTS assessment. She was referred by her therapist at [facility] counseling center. Patient reports increased depression. Her depression is triggered by guilt. She is the mother of a 50 yr old boy. She left her son in [place], [place] to be raised by her family so that she is able to finish school. Patient feels bad that she is not around to raise her son. She is in her 3rd semester of college. Patient reports depressive symptoms of hopelessness, isolating self from others, crying spells, and fatigue. Patient is also grieving. Sts that the father of her son passed away 07/18/14. Patient has suicidal thoughts, no plan, no intent. She denies history of suicide attempts. She denies self mutilating behaviors. Patient however starves herself or refused to sleep as a form of punishing herself. She denies HI and AVH's. She is calm and cooperative. She reports regular use of THC.   Principal Problem: MDD (major depressive disorder), recurrent episode, severe [facility]) Discharge Diagnoses: Patient Active Problem List   Diagnosis Date Noted  . Severe episode of recurrent major depressive disorder, without psychotic features (HCC) [F33.2]   . MDD (major depressive disorder), recurrent episode, severe (HCC) [F33.2] 12/28/2015  . MDD (major depressive disorder) (HCC) [F32.9] 12/28/2015    Past Psychiatric History: See Above  Past Medical History:  Past Medical History  Diagnosis Date  . Asthma   . Arthritis     Past Surgical History  Procedure Laterality Date  . Tonsillectomy     Family History:  History reviewed. No pertinent family history. Family Psychiatric  History: See H&P Social History:  History  Alcohol Use No     History  Drug Use No    Social History   Social History  . Marital Status: Single    Spouse Name: N/A  . Number of Children: N/A  . Years of Education: N/A   Social History Main Topics  . Smoking status: Never Smoker   . Smokeless tobacco: Never Used  . Alcohol Use: No  . Drug Use: No  . Sexual Activity: Yes    Birth Control/ Protection: None   Other Topics Concern  . None   Social History Narrative    Hospital Course:  Terri Stafford was admitted for MDD (major depressive disorder), recurrent episode, severe (HCC) and crisis management.  Pt was treated discharged with the medications listed below under Medication List.  Medical problems were identified and treated as needed.  Home medications were restarted as appropriate.  Improvement was monitored by observation and Terri Stafford 's daily report of symptom Stafford.  Emotional and mental status was monitored by daily self-inventory reports completed by Terri Stafford and clinical staff.         Terri Stafford was evaluated by the treatment team for stability and plans for continued recovery upon discharge. Terri Stafford was an integral factor for scheduling further treatment. Employment, transportation, bed availability, health status, family support, and any pending legal issues were also considered during hospital stay. Pt was offered further treatment options upon discharge including but not limited to Residential, Intensive Outpatient, and  Outpatient treatment.  Terri Stafford follow Stafford with the services as listed below under Follow Stafford Information.     Upon completion of this admission the patient was both mentally and medically stable for discharge denying suicidal/homicidal ideation, auditory/visual/tactile hallucinations, delusional thoughts and  paranoia.    Terri Stafford responded well to treatment with Remeron and Vistaril without adverse effects.  Pt demonstrated improvement without reported or observed adverse effects to the point of stability appropriate for outpatient management. Pertinent labs include: CBC for which outpatient follow-Stafford is necessary for lab recheck as mentioned below. Reviewed CBC, CMP, BAL, and UDS+ THC; all unremarkable aside from noted exceptions.   Physical Findings: AIMS:  , ,  ,  ,    CIWA:    COWS:  COWS Total Score: 1  Musculoskeletal: Strength & Muscle Tone: within normal limits Gait & Station: normal Patient leans: N/A  Psychiatric Specialty Exam: See SRA BY MD Review of Systems  Psychiatric/Behavioral: Negative for substance abuse. Depression: stable. The patient is not nervous/anxious.   All other systems reviewed and are negative.   Blood pressure 123/80, pulse 62, temperature 97.8 F (36.6 C), temperature source Oral, resp. rate 16, height 5\' 2"  (1.575 m), weight 67.132 kg (148 lb), last menstrual period 12/20/2015, SpO2 100 %.Body mass index is 27.06 kg/(m^2).  Have you used any form of tobacco in the last 30 days? (Cigarettes, Smokeless Tobacco, Cigars, and/or Pipes): No  Has this patient used any form of tobacco in the last 30 days? (Cigarettes, Smokeless Tobacco, Cigars, and/or Pipes) Yes, No  Blood Alcohol level:  Lab Results  Component Value Date   ETH <5 12/27/2015    Metabolic Disorder Labs:  No results found for: HGBA1C, MPG No results found for: PROLACTIN No results found for: CHOL, TRIG, HDL, CHOLHDL, VLDL, LDLCALC  See Psychiatric Specialty Exam and Suicide Risk Assessment completed by Attending Physician prior to discharge.  Discharge destination:  Home  Is patient on multiple antipsychotic therapies at discharge:  No   Has Patient had three or more failed trials of antipsychotic monotherapy by history:  No  Recommended Plan for Multiple Antipsychotic  Therapies: NA  Discharge Instructions    Diet general    Complete by:  As directed      Discharge instructions    Complete by:  As directed   Take all medications as prescribed. Keep all follow-Stafford appointments as scheduled.  Do not consume alcohol or use illegal drugs while on prescription medications. Report any adverse effects from your medications to your primary care provider promptly.  In the event of recurrent symptoms or worsening symptoms, call 911, a crisis hotline, or go to the nearest emergency department for evaluation.     Increase activity slowly    Complete by:  As directed             Medication List    STOP taking these medications        predniSONE 50 MG tablet  Commonly known as:  DELTASONE      TAKE these medications      Indication   albuterol 108 (90 Base) MCG/ACT inhaler  Commonly known as:  PROVENTIL HFA;VENTOLIN HFA  Inhale 1-2 puffs into the lungs every 4 (four) hours as needed for wheezing or shortness of breath.      camphor-menthol lotion  Commonly known as:  SARNA  Apply topically 2 (two) times daily. Neck      mirtazapine 15 MG tablet  Commonly known as:  REMERON  Take 1 tablet (15 mg total) by mouth at bedtime.   Indication:  Trouble Sleeping, Major Depressive Disorder           Follow-Stafford Information    Follow Stafford with [facility] On 01/11/2016.   Why:  Hospital follow Stafford appointment with Genia Del, [facility] on Thursday May 11th at [facility]. Call office if you need to reschedule.    Contact information:   [address]  [place] [phone]      Follow-Stafford recommendations:  Activity:  as tolerated Diet:  heart heart  Comments:  Take all medications as prescribed. Keep all follow-Stafford appointments as scheduled.  Do not consume alcohol or use illegal drugs while on prescription medications. Report any adverse effects from your medications to your primary care provider promptly.  In the event of recurrent symptoms or  worsening symptoms, call 911, a crisis hotline, or go to the nearest emergency department for evaluation.   Signed: Oneta Rack, NP 01/01/2016, 11:49 AM   Patient seen, Suicide Assessment Completed.  Disposition Plan Reviewed
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
## 2016-01-01 NOTE — Progress Notes (Signed)
  [facility] Discharge Plan :  Will you be returning to the same living situation after discharge:  Yes,  patient plans to return home At discharge, do you have transportation home?: Yes,  friends/family Do you have the ability to pay for your medications: Yes,  patient will be provided with prescriptions at discharge  Release of information consent forms completed and in the chart;  Patient's signature needed at discharge.  Patient to Follow up at: Follow-up Information    [facility] On 01/11/2016.   Why:  Hospital follow up appointment with [facility]helby Carlson, [facility] on [facility] May 11th at [facility]9am. Call office if you need to reschedule.    Contact information:   [address]  [place] [facility] [phone]      Next level of care provider [facility] Link:no  Safety Planning and Suicide Prevention discussed: Yes,  with patient and mother  Have you used any form of tobacco in the last 30 days? (Cigarettes, Smokeless Tobacco, Cigars, and/or Pipes): No  Has patient been referred to the Quitline?: N/A patient is not a smoker  Patient has been referred for addiction treatment: Yes  Myrtle Haller, [place]in L 01/01/2016, 10:24 AM

## 2016-01-01 NOTE — Progress Notes (Signed)
Patient ID: Terri [name], female   DOB: 11-17-1995, 20 y.o.   MRN: [id number] Pt was discharged to lobby per MD order. AVS, discharge report, SRA, scripts, med samples, and letter were all given, reviewed, and signed for when appropriate. Patient was given an opportunity to have questions answered. Pt verbalized readiness for discharge and appeared in NAD.

## 2016-01-01 NOTE — BHH Suicide Risk Assessment (Signed)
[facility]   Principal Problem: MDD (major depressive disorder), recurrent episode, severe (HCC) Discharge Diagnoses:  Patient Active Problem List   Diagnosis Date Noted  . Severe episode of recurrent major depressive disorder, without psychotic features (HCC) [F33.2]   . MDD (major depressive disorder), recurrent episode, severe (HCC) [F33.2] 12/28/2015  . MDD (major depressive disorder) (HCC) [F32.9] 12/28/2015    Total Time spent with patient: 30 minutes  Musculoskeletal: Strength & Muscle Tone: within normal limits Gait & Station: normal Patient leans: N/A  Psychiatric Specialty Exam: ROS  Blood pressure 123/80, pulse 62, temperature 97.8 F (36.6 C), temperature source Oral, resp. rate 16, height 5\' 2"  (1.575 m), weight 148 lb (67.132 kg), last menstrual period 12/20/2015, SpO2 100 %.Body mass index is 27.06 kg/(m^2).  General Appearance: Well Groomed  Eye Contact::  Good  Speech:  Normal Rate409  Volume:  Normal  Mood:  improved, states feeling better, affect more reactive, brighter   Affect:  Full Range  Thought Process:  Linear  Orientation:  Full (Time, Place, and Person)  Thought Content:  no hallucinations, no delusions   Suicidal Thoughts:  No denies any suicidal ideations, denies any self injurious ideations   Homicidal Thoughts:  No  Memory:  recent and remote grossly intact   Judgement:  Improved   Insight:  improved  Psychomotor Activity:  Normal  Concentration:  Good  Recall:  Good  Fund of Knowledge:Good  Language: Good  Akathisia:  Negative  Handed:  Right  AIMS (if indicated):     Assets:  Communication Skills Desire for Improvement Resilience  Sleep:  Number of Hours: 5.25  Cognition: WNL  ADL's:  Intact   Mental Status Per Nursing Assessment::   On Admission:     Demographic Factors:  20 year old single female, college student, has a 20 year old child, who lives with patient's mother   Loss Factors: Death of BF  in a MVA, stressors inherent to college , financial issues , not seeing her child as often as she would want to  Historical Factors: History of depression  Risk Reduction Factors:   Responsible for children under 20 years of age, Sense of responsibility to family, Religious beliefs about death, Positive social support and Positive coping skills or problem solving skills  Continued Clinical Symptoms:  At this time patient improved, presents fully alert, attentive,well related, mood improved , affect reactive, brighter, no thought disorder, no SI or HI, no psychotic symptoms, future oriented . Denies medication side effects- we discussed side effect profile to include potential for increased suicidal ideations early in treatment with antidepressants in young adults, and sedating, weight gain potential.   Cognitive Features That Contribute To Risk:  No gross cognitive deficits noted upon discharge. Is alert , attentive, and oriented x 3    Suicide Risk:  Mild:  Suicidal ideation of limited frequency, intensity, duration, and specificity.  There are no identifiable plans, no associated intent, mild dysphoria and related symptoms, good self-control (both objective and subjective assessment), few other risk factors, and identifiable protective factors, including available and accessible social support.  Follow-up Information    [facility] On 01/11/2016.   Why:  Hospital follow up appointment with Genia DelShelby Carlson, [facility] on Thursday May 11th at [facility]9am. Call office if you need to reschedule.    Contact information:   [address]  [place] [phone]      Plan Of Care/Follow-up recommendations:  Activity:  as  tolerated  Diet:  Regular Tests:  NA Other:  see below  Patient discharging in good spirits  Plans to return to college, and later to home with mother, once she completes exams/semester Follow up as above  Nehemiah Massed, MD 01/01/2016, 12:45 PM

## 2016-01-17 ENCOUNTER — Inpatient Hospital Stay (HOSPITAL_COMMUNITY)
Admission: EM | Admit: 2016-01-17 | Discharge: 2016-01-20 | DRG: 864 | Disposition: A | Discharge status: Home / Self Care | Payer: Self-pay | Attending: Family Medicine | Admitting: Family Medicine

## 2016-01-17 ENCOUNTER — Emergency Department (HOSPITAL_COMMUNITY): Payer: Self-pay

## 2016-01-17 ENCOUNTER — Observation Stay (HOSPITAL_COMMUNITY): Payer: MEDICAID

## 2016-01-17 ENCOUNTER — Encounter (HOSPITAL_COMMUNITY): Payer: Self-pay

## 2016-01-17 DIAGNOSIS — M791 Myalgia, unspecified site: Secondary | ICD-10-CM | POA: Insufficient documentation

## 2016-01-17 DIAGNOSIS — Z880 Allergy status to penicillin: Secondary | ICD-10-CM

## 2016-01-17 DIAGNOSIS — R1033 Periumbilical pain: Secondary | ICD-10-CM

## 2016-01-17 DIAGNOSIS — E860 Dehydration: Secondary | ICD-10-CM | POA: Diagnosis present

## 2016-01-17 DIAGNOSIS — R519 Headache, unspecified: Secondary | ICD-10-CM

## 2016-01-17 DIAGNOSIS — M2550 Pain in unspecified joint: Secondary | ICD-10-CM | POA: Insufficient documentation

## 2016-01-17 DIAGNOSIS — Z7982 Long term (current) use of aspirin: Secondary | ICD-10-CM

## 2016-01-17 DIAGNOSIS — R51 Headache: Secondary | ICD-10-CM

## 2016-01-17 DIAGNOSIS — E876 Hypokalemia: Secondary | ICD-10-CM | POA: Diagnosis present

## 2016-01-17 DIAGNOSIS — R109 Unspecified abdominal pain: Secondary | ICD-10-CM

## 2016-01-17 DIAGNOSIS — J4520 Mild intermittent asthma, uncomplicated: Secondary | ICD-10-CM | POA: Diagnosis present

## 2016-01-17 DIAGNOSIS — M0800 Unspecified juvenile rheumatoid arthritis of unspecified site: Secondary | ICD-10-CM | POA: Diagnosis present

## 2016-01-17 DIAGNOSIS — F419 Anxiety disorder, unspecified: Secondary | ICD-10-CM | POA: Diagnosis present

## 2016-01-17 DIAGNOSIS — R509 Fever, unspecified: Principal | ICD-10-CM | POA: Diagnosis present

## 2016-01-17 DIAGNOSIS — G43909 Migraine, unspecified, not intractable, without status migrainosus: Secondary | ICD-10-CM | POA: Diagnosis present

## 2016-01-17 DIAGNOSIS — R739 Hyperglycemia, unspecified: Secondary | ICD-10-CM

## 2016-01-17 DIAGNOSIS — F339 Major depressive disorder, recurrent, unspecified: Secondary | ICD-10-CM

## 2016-01-17 DIAGNOSIS — F329 Major depressive disorder, single episode, unspecified: Secondary | ICD-10-CM | POA: Diagnosis present

## 2016-01-17 DIAGNOSIS — E871 Hypo-osmolality and hyponatremia: Secondary | ICD-10-CM | POA: Diagnosis present

## 2016-01-17 HISTORY — DX: Major depressive disorder, single episode, unspecified: F32.9

## 2016-01-17 LAB — URINALYSIS, ROUTINE W REFLEX MICROSCOPIC
Bilirubin Urine: NEGATIVE
GLUCOSE, UA: NEGATIVE mg/dL
KETONES UR: NEGATIVE mg/dL
LEUKOCYTES UA: NEGATIVE
Nitrite: NEGATIVE
PH: 6 (ref 5.0–8.0)
Protein, ur: 30 mg/dL — AB
Specific Gravity, Urine: 1.035 — ABNORMAL HIGH (ref 1.005–1.030)

## 2016-01-17 LAB — CBC WITH DIFFERENTIAL/PLATELET
BASOS ABS: 0 10*3/uL (ref 0.0–0.1)
Basophils Relative: 0 %
EOS ABS: 0.2 10*3/uL (ref 0.0–0.7)
Eosinophils Relative: 3 %
HEMATOCRIT: 34.9 % — AB (ref 36.0–46.0)
Hemoglobin: 11 g/dL — ABNORMAL LOW (ref 12.0–15.0)
LYMPHS ABS: 2.2 10*3/uL (ref 0.7–4.0)
Lymphocytes Relative: 30 %
MCH: 26.1 pg (ref 26.0–34.0)
MCHC: 31.5 g/dL (ref 30.0–36.0)
MCV: 82.9 fL (ref 78.0–100.0)
MONOS PCT: 9 %
Monocytes Absolute: 0.7 10*3/uL (ref 0.1–1.0)
NEUTROS PCT: 58 %
Neutro Abs: 4.4 10*3/uL (ref 1.7–7.7)
Platelets: 274 10*3/uL (ref 150–400)
RBC: 4.21 MIL/uL (ref 3.87–5.11)
RDW: 17.3 % — AB (ref 11.5–15.5)
WBC: 7.5 10*3/uL (ref 4.0–10.5)

## 2016-01-17 LAB — I-STAT CG4 LACTIC ACID, ED
LACTIC ACID, VENOUS: 0.98 mmol/L (ref 0.5–2.0)
Lactic Acid, Venous: 1.41 mmol/L (ref 0.5–2.0)

## 2016-01-17 LAB — MONONUCLEOSIS SCREEN: MONO SCREEN: NEGATIVE

## 2016-01-17 LAB — COMPREHENSIVE METABOLIC PANEL
ALBUMIN: 4 g/dL (ref 3.5–5.0)
ALK PHOS: 46 U/L (ref 38–126)
ALT: 13 U/L — AB (ref 14–54)
AST: 17 U/L (ref 15–41)
Anion gap: 9 (ref 5–15)
BILIRUBIN TOTAL: 0.7 mg/dL (ref 0.3–1.2)
BUN: 10 mg/dL (ref 6–20)
CO2: 23 mmol/L (ref 22–32)
CREATININE: 0.79 mg/dL (ref 0.44–1.00)
Calcium: 8.4 mg/dL — ABNORMAL LOW (ref 8.9–10.3)
Chloride: 101 mmol/L (ref 101–111)
GFR calc Af Amer: >60 mL/min (ref >60)
GFR calc non Af Amer: >60 mL/min (ref >60)
GLUCOSE: 128 mg/dL — AB (ref 65–99)
Potassium: 3.4 mmol/L — ABNORMAL LOW (ref 3.5–5.1)
SODIUM: 133 mmol/L — AB (ref 135–145)
Total Protein: 7.3 g/dL (ref 6.5–8.1)

## 2016-01-17 LAB — I-STAT BETA HCG BLOOD, ED (MC, WL, AP ONLY)

## 2016-01-17 LAB — C-REACTIVE PROTEIN

## 2016-01-17 LAB — LIPASE, BLOOD: LIPASE: 17 U/L (ref 11–51)

## 2016-01-17 LAB — URINE MICROSCOPIC-ADD ON

## 2016-01-17 LAB — SEDIMENTATION RATE: Sed Rate: 19 mm/hr (ref 0–22)

## 2016-01-17 LAB — PHOSPHORUS: PHOSPHORUS: 2.4 mg/dL — AB (ref 2.5–4.6)

## 2016-01-17 LAB — PROCALCITONIN: Procalcitonin: <0.1 ng/mL

## 2016-01-17 LAB — MAGNESIUM: Magnesium: 2 mg/dL (ref 1.7–2.4)

## 2016-01-17 MED ORDER — OXYCODONE HCL 5 MG PO TABS
5.0000 mg | ORAL_TABLET | Freq: Four times a day (QID) | ORAL | Status: DC | PRN
Start: 2016-01-17 — End: 2016-01-18
  Administered 2016-01-17 – 2016-01-18 (×3): 5 mg via ORAL
  Filled 2016-01-17 (×3): qty 1

## 2016-01-17 MED ORDER — KETOROLAC TROMETHAMINE 30 MG/ML IJ SOLN
30.0000 mg | Freq: Once | INTRAMUSCULAR | Status: AC
  Administered 2016-01-17: 30 mg via INTRAVENOUS
  Filled 2016-01-17: qty 1

## 2016-01-17 MED ORDER — IBUPROFEN 200 MG PO TABS: 400.0000 mg | ORAL_TABLET | Freq: Four times a day (QID) | ORAL | Status: DC | PRN

## 2016-01-17 MED ORDER — IPRATROPIUM-ALBUTEROL 0.5-2.5 (3) MG/3ML IN SOLN: 3.0000 mL | RESPIRATORY_TRACT | Status: DC | PRN

## 2016-01-17 MED ORDER — SODIUM CHLORIDE 0.9 % IV BOLUS (SEPSIS)
1000.0000 mL | Freq: Once | INTRAVENOUS | Status: AC
  Administered 2016-01-17: 1000 mL via INTRAVENOUS

## 2016-01-17 MED ORDER — IPRATROPIUM-ALBUTEROL 0.5-2.5 (3) MG/3ML IN SOLN
3.0000 mL | Freq: Four times a day (QID) | RESPIRATORY_TRACT | Status: DC | PRN
  Administered 2016-01-17: 3 mL via RESPIRATORY_TRACT
  Filled 2016-01-17: qty 3

## 2016-01-17 MED ORDER — IOPAMIDOL (ISOVUE-300) INJECTION 61%
100.0000 mL | Freq: Once | INTRAVENOUS | Status: AC | PRN
  Administered 2016-01-17: 100 mL via INTRAVENOUS

## 2016-01-17 MED ORDER — ONDANSETRON HCL 4 MG PO TABS
4.0000 mg | ORAL_TABLET | Freq: Four times a day (QID) | ORAL | Status: DC | PRN
  Administered 2016-01-19: 4 mg via ORAL
  Filled 2016-01-17: qty 1

## 2016-01-17 MED ORDER — PANTOPRAZOLE SODIUM 40 MG PO TBEC
40.0000 mg | DELAYED_RELEASE_TABLET | Freq: Every day | ORAL | Status: DC
  Administered 2016-01-18 – 2016-01-20 (×2): 40 mg via ORAL
  Filled 2016-01-17 (×3): qty 1

## 2016-01-17 MED ORDER — MIRTAZAPINE 15 MG PO TABS
15.0000 mg | ORAL_TABLET | Freq: Every day | ORAL | Status: DC
  Administered 2016-01-17 – 2016-01-18 (×2): 15 mg via ORAL
  Filled 2016-01-17 (×4): qty 1

## 2016-01-17 MED ORDER — IPRATROPIUM-ALBUTEROL 0.5-2.5 (3) MG/3ML IN SOLN
3.0000 mL | Freq: Four times a day (QID) | RESPIRATORY_TRACT | Status: DC
  Administered 2016-01-17 – 2016-01-18 (×5): 3 mL via RESPIRATORY_TRACT
  Filled 2016-01-17 (×4): qty 3

## 2016-01-17 MED ORDER — LIDOCAINE HCL 1 % IJ SOLN
INTRAMUSCULAR | Status: AC
  Filled 2016-01-17: qty 20

## 2016-01-17 MED ORDER — ACETAMINOPHEN 650 MG RE SUPP: 650.0000 mg | Freq: Four times a day (QID) | RECTAL | Status: DC | PRN

## 2016-01-17 MED ORDER — ACETAMINOPHEN 325 MG PO TABS
650.0000 mg | ORAL_TABLET | Freq: Once | ORAL | Status: AC | PRN
  Administered 2016-01-17: 650 mg via ORAL
  Filled 2016-01-17: qty 2

## 2016-01-17 MED ORDER — HYDROXYZINE HCL 25 MG PO TABS: 25.0000 mg | ORAL_TABLET | Freq: Four times a day (QID) | ORAL | Status: DC | PRN

## 2016-01-17 MED ORDER — LORATADINE 10 MG PO TABS
10.0000 mg | ORAL_TABLET | Freq: Every day | ORAL | Status: DC
  Administered 2016-01-18 – 2016-01-20 (×2): 10 mg via ORAL
  Filled 2016-01-17 (×3): qty 1

## 2016-01-17 MED ORDER — SODIUM CHLORIDE 0.9 % IV BOLUS (SEPSIS)
250.0000 mL | Freq: Once | INTRAVENOUS | Status: AC
Start: 2016-01-17 — End: 2016-01-17
  Administered 2016-01-17: 250 mL via INTRAVENOUS

## 2016-01-17 MED ORDER — SODIUM CHLORIDE 0.9 % IV SOLN
INTRAVENOUS | Status: DC
  Administered 2016-01-17 – 2016-01-18 (×2): via INTRAVENOUS

## 2016-01-17 MED ORDER — IPRATROPIUM-ALBUTEROL 0.5-2.5 (3) MG/3ML IN SOLN: 3.0000 mL | Freq: Four times a day (QID) | RESPIRATORY_TRACT | Status: DC

## 2016-01-17 MED ORDER — TOPIRAMATE 25 MG PO TABS
50.0000 mg | ORAL_TABLET | Freq: Two times a day (BID) | ORAL | Status: DC
Start: 2016-01-17 — End: 2016-01-20
  Administered 2016-01-17 – 2016-01-20 (×5): 50 mg via ORAL
  Filled 2016-01-17 (×7): qty 2

## 2016-01-17 MED ORDER — ONDANSETRON HCL 4 MG/2ML IJ SOLN
4.0000 mg | Freq: Four times a day (QID) | INTRAMUSCULAR | Status: DC | PRN
  Administered 2016-01-18: 4 mg via INTRAVENOUS
  Filled 2016-01-17: qty 2

## 2016-01-17 MED ORDER — IPRATROPIUM-ALBUTEROL 0.5-2.5 (3) MG/3ML IN SOLN
3.0000 mL | RESPIRATORY_TRACT | Status: DC
  Filled 2016-01-17: qty 3

## 2016-01-17 MED ORDER — MORPHINE SULFATE (PF) 2 MG/ML IV SOLN
2.0000 mg | Freq: Once | INTRAVENOUS | Status: AC
  Administered 2016-01-18: 2 mg via INTRAVENOUS
  Filled 2016-01-17: qty 1

## 2016-01-17 MED ORDER — POTASSIUM CHLORIDE CRYS ER 20 MEQ PO TBCR: 40.0000 meq | EXTENDED_RELEASE_TABLET | ORAL | Status: AC

## 2016-01-17 MED ORDER — MORPHINE SULFATE (PF) 4 MG/ML IV SOLN
4.0000 mg | Freq: Once | INTRAVENOUS | Status: AC
  Administered 2016-01-17: 4 mg via INTRAVENOUS
  Filled 2016-01-17: qty 1

## 2016-01-17 MED ORDER — BUTALBITAL-APAP-CAFFEINE 50-325-40 MG PO TABS: 2.0000 | ORAL_TABLET | Freq: Once | ORAL | Status: DC

## 2016-01-17 MED ORDER — HEPARIN SODIUM (PORCINE) 5000 UNIT/ML IJ SOLN
5000.0000 [IU] | Freq: Three times a day (TID) | INTRAMUSCULAR | Status: DC
  Filled 2016-01-17 (×11): qty 1

## 2016-01-17 MED ORDER — ACETAMINOPHEN 325 MG PO TABS
650.0000 mg | ORAL_TABLET | Freq: Four times a day (QID) | ORAL | Status: DC | PRN
  Administered 2016-01-17 – 2016-01-20 (×6): 650 mg via ORAL
  Filled 2016-01-17 (×6): qty 2

## 2016-01-17 MED ORDER — KETOROLAC TROMETHAMINE 30 MG/ML IJ SOLN
30.0000 mg | Freq: Three times a day (TID) | INTRAMUSCULAR | Status: DC | PRN
  Administered 2016-01-18: 30 mg via INTRAVENOUS
  Filled 2016-01-17 (×2): qty 1

## 2016-01-17 NOTE — ED Notes (Signed)
MD at bedside to perfrom lumbar procedure, labs will be delayed.

## 2016-01-17 NOTE — ED Notes (Signed)
MD at bedside. 

## 2016-01-17 NOTE — H&P (Signed)
History and Physical    Erikka Follmer UAW:[id number] DOB: 1996-01-04 DOA: 01/17/2016  Referring Provider: Dr. Lynelle Doctor PCP: No primary care provider on file.   Outpatient Specialists:  None currently   Patient coming from: home  Chief Complaint: fever, general malaise and myalgias  HPI: Terri Stafford is a 20 y.o. female with PMH significant for JRA ( currently not taking any treatment) and uncomplicated asthma; who presented to ED complaining of fever, general malaise and myalgias. Symptoms has been present for the last 4-5 days and worsening. In the last 2 days patient has also reported some HA's and feeling sick in her stomach; reason why she has not been eating or drinking much. She denies any dysuria. She denies any diarrhea. Vaginal discharge or bleeding. Patient does not have a history of similar episodes in the past. She denies any recent trips or travel. No known ill contacts. No known tick bites. No rashes or petechiae. On exam she is dehydrated and her UA has elevated specific gravity.  ED Course: images studies r/o acute intracranial abnormalities and acute cardiopulmonary process. Patient UA w/o signs of UTI. She was Febrile at 103 and complaining of abd pain. Cultures taken; LP unsuccessful (by ED and IR). No abx's initiated (as discussed/recomended by ID).   Review of Systems:  All other systems reviewed and apart from HPI, are negative.  Past Medical History  Diagnosis Date  . Asthma   . 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 Tonsillectomy       reports that she has never smoked. She has never used smokeless tobacco. She reports that she does not drink alcohol or use illicit drugs.  Allergies  Allergen Reactions  . Penicillins Hives     Has patient had a PCN reaction causing immediate rash, facial/tongue/throat swelling, SOB or lightheadedness with hypotension:No Has patient had a PCN  reaction causing severe rash involving mucus membranes or skin necrosis: No Has patient had a PCN reaction that required hospitalization-yes  Has patient had a PCN reaction occurring within the last 10 years: No If all of the above answers are "NO", then may proceed with Cephalosporin use.     History reviewed. No pertinent family history.   Prior to Admission medications   Medication Sig Start Date End Date Taking? Authorizing Provider  albuterol (PROVENTIL HFA;VENTOLIN HFA) 108 (90 BASE) MCG/ACT inhaler Inhale 1-2 puffs into the lungs every 4 (four) hours as needed for wheezing or shortness of breath. 07/31/15  Yes Stevi Barrett, PA-C  aspirin-acetaminophen-caffeine (EXCEDRIN MIGRAINE) 250-250-65 MG tablet Take 2 tablets by mouth every 4 (four) hours as needed for headache.   Yes Historical Provider, MD  camphor-menthol Wynelle Fanny) lotion Apply topically 2 (two) times daily. Neck 01/01/16  Yes Oneta Rack, NP  hydrOXYzine (ATARAX/VISTARIL) 25 MG tablet Take 1 tablet (25 mg total) by mouth every 6 (six) hours as needed for anxiety. 01/01/16  Yes Sanjuana Kava, NP  ibuprofen (ADVIL,MOTRIN) 200 MG tablet Take 200 mg by mouth every 6 (six) hours as needed for moderate pain.   Yes Historical Provider, MD  mirtazapine (REMERON) 15 MG tablet Take 1 tablet (15 mg total) by mouth at bedtime. 01/01/16  Yes Oneta Rack, NP  Phenyleph-Diphenhyd-DM-APAP (FLU FORMULA PO) Take 30 mLs by mouth daily as needed (flu like symptoms).   Yes Historical Provider, MD    Physical Exam: Filed Vitals:   01/17/16  1530 01/17/16 1630 01/17/16 1700 01/17/16 1736  BP: 128/73 111/62 110/71 113/67  Pulse: 92 65 69 68  Temp:      TempSrc:      Resp: _0 SpO2: 97% 100% 100% 100%    Constitutional: in mild distress secondary to myalgias and general malaise; denies CP and SOB. Patient warm to touch. denies SOB. Eyes: PERRLA, lids and conjunctivae normal; no nystagmus, no icterus ENMT: Mucous membranes are moist.  Posterior pharynx clear of any exudate or lesions. Normal dentition.  Neck: normal, supple, no masses, no thyromegaly, no JVD Respiratory: clear to auscultation bilaterally, no wheezing, no crackles. Normal respiratory effort. No accessory muscle use.  Cardiovascular: S1 & S2 heard, regular rate and rhythm, no murmurs / rubs / gallops. No extremity edema. 2+ pedal pulses. No carotid bruits.  Abdomen: Soft, No distension, positive tenderness in mid abdomen, no masses palpated. No hepatosplenomegaly. Bowel sounds normal. No guarding. Musculoskeletal: no clubbing / cyanosis. No joint deformity upper and lower extremities. Good ROM, no contractures. Normal muscle tone.  Skin: no rashes, lesions, ulcers. No induration Neurologic: CN 2-12 grossly intact. Sensation intact, DTR normal. Strength 5/5 in all 4 limbs.  Psychiatric: Normal judgment and insight. Alert and oriented x 3. Normal mood.    Labs on Admission: I have personally reviewed following labs and imaging studies  CBC:  Recent Labs Lab 01/17/16 1033  WBC 7.5  NEUTROABS 4.4  HGB 11.0*  HCT 34.9*  MCV 82.9  PLT 606   Basic Metabolic Panel:  Recent Labs Lab 01/17/16 1033  NA 133*  K 3.4*  CL 101  CO2 23  GLUCOSE 128*  BUN 10  CREATININE 0.79  CALCIUM 8.4*   GFR: CrCl cannot be calculated (Unknown ideal weight.).   Liver Function Tests:  Recent Labs Lab 01/17/16 1033  AST 17  ALT 13*  ALKPHOS 46  BILITOT 0.7  PROT 7.3  ALBUMIN 4.0    Recent Labs Lab 01/17/16 1819  LIPASE 17     Urine analysis:    Component Value Date/Time   COLORURINE YELLOW 01/17/2016 1100   APPEARANCEUR CLOUDY* 01/17/2016 1100   LABSPEC 1.035* 01/17/2016 1100   PHURINE 6.0 01/17/2016 1100   GLUCOSEU NEGATIVE 01/17/2016 1100   HGBUR TRACE* 01/17/2016 1100   BILIRUBINUR NEGATIVE 01/17/2016 1100   KETONESUR NEGATIVE 01/17/2016 1100   PROTEINUR 30* 01/17/2016 1100   NITRITE NEGATIVE 01/17/2016 [place]  01/17/2016 1100   Sepsis Labs: Lactic acid normal  Radiological Exams on Admission: Dg Chest 2 View  01/17/2016  CLINICAL DATA:  Fever and cough EXAM: CHEST  2 VIEW COMPARISON:  07/31/2015 FINDINGS: The heart size and mediastinal contours are within normal limits. Both lungs are clear. The visualized skeletal structures are unremarkable. IMPRESSION: No active cardiopulmonary disease. Electronically Signed   By: Inez Catalina M.D.   On: 01/17/2016 11:21   Ct Head Wo Contrast  01/17/2016  CLINICAL DATA:  Headache, fever, body aches, and cough for few days. EXAM: CT HEAD WITHOUT CONTRAST TECHNIQUE: Contiguous axial images were obtained from the base of the skull through the vertex without intravenous contrast. COMPARISON:  None. FINDINGS: There is no evidence of acute cortical infarct, intracranial hemorrhage, mass, midline shift, or extra-axial fluid collection. Ventricles and sulci are normal in size. A 3 mm hypodense focus in the left occipital lobe (series 2, image 15) is felt to reflect slight asymmetry of the left occipital horn, of no clinical significance. Visualized orbits are  unremarkable. Minimal bilateral ethmoid air cell mucosal thickening is partially visualized. The mastoid air cells are clear. No acute osseous abnormality is seen. IMPRESSION: Negative head CT. Electronically Signed   By: Logan Bores M.D.   On: 01/17/2016 11:31   Dg Lumbar Puncture Fluoro Guide  01/17/2016  CLINICAL DATA:  Fever and headache.  Possible meningitis. EXAM: DIAGNOSTIC LUMBAR PUNCTURE UNDER FLUOROSCOPIC GUIDANCE FLUOROSCOPY TIME:  Radiation Exposure Index (as provided by the fluoroscopic device): 38.5 mGy If the device does not provide the exposure index: Fluoroscopy Time (in minutes and seconds): Number of Acquired Images:  None. PROCEDURE: Informed consent was obtained from the patient prior to the procedure, including potential complications of headache, allergy, and pain. With the patient prone, the lower  back was prepped with Betadine. 1% Lidocaine was used for local anesthesia. Lumbar puncture was performed at the L3-4 and L4-5 levels using a 28 gauge needle. Unfortunately, no CSF was obtained and the patient asked to terminate the procedure. IMPRESSION: Unsuccessful fluoroscopically guided lumbar puncture. This was discussed with Dr. Tomi Bamberger prior to dictation. Electronically Signed   By: Lorin Picket M.D.   On: 01/17/2016 15:08    EKG:  None  Assessment/Plan 1-FUO (fever of unknown origin): -etiology unclear -CXR neg, no abnormalities on UA and normal CT head -will check CT abd and pelvis -will check CRP, procalcitonin, influenza, HIV and mononucleosis  -patient with normal ESR and normal WBC's -LP unsuccessful -will check blood cultures and follow clinical response -PRN antipyretics, analgesics and supportive care -no antibiotics for now   2-MDD (major depressive disorder) (HCC)/anxiety: -not on any antidepressant currently -denies SI or hallucinations -continue PRN atarax   3-Mild intermittent asthma: -continue PRN duoneb -no wheezing  4-JRA (juvenile rheumatoid arthritis) ([place]): -was on methotrexate until approx 2 1/2 years ago -not taking any medication currently and w/o insurance to follow with rheumatologist   5-Hypokalemia: due to poor intake most likely -will replete -check Mg, PO4 and check BMET in am  6-Dehydration with hyponatremia: -will provide IVF's resuscitation -follow electrolytes trend    Time: 65 minutes   DVT prophylaxis: heparin  Code Status: Full Family Communication: sister at bedside  Disposition Plan: home when medically stable; hopefully no more than 2 midnights  Consults called: ID (Dr. Tommy Medal) Admission status: observation, LOS < 2 midnights, med-surg bed   Barton Dubois MD [facility] Pager [phone]  If 7PM-7AM, please contact night-coverage [email or web address] Password [facility]  01/17/2016, 5:53 PM

## 2016-01-17 NOTE — ED Notes (Signed)
She c/o fever/body aches and cough x few days.

## 2016-01-17 NOTE — ED Notes (Signed)
Patient transported to X-ray 

## 2016-01-17 NOTE — ED Notes (Signed)
Hospitalist at bedside 

## 2016-01-17 NOTE — ED Notes (Signed)
Unable to collect labs patient is not in the room 

## 2016-01-17 NOTE — ED Provider Notes (Addendum)
CSN: [id number]     Arrival date & time 01/17/16  0865 History   First MD Initiated Contact with Patient 01/17/16 [id number]     Chief Complaint  Patient presents with  . Fever   HPI The patient presents to the emergency room for evaluation of fever. Symptoms started about 4-5 days ago. She developed a headache and diffuse body aches. Patient is hurting from head to toe. She has tried multiple medications for the headache but that has not improved. She's had a poor appetite and a mild sore throat. She has not been coughing. She did have one episode of vomiting but that was today none previously. She denies any dysuria. She denies any diarrhea. Vaginal discharge or bleeding. Patient does not have a history of similar episodes in the past.  She denies any recent trips or travel. No known ill contacts. No known tick bites. No rashes.  Her symptoms have progressed.  Today she is feeling much worse. She is also having some sharp intermittent stomach pains. She is not sure if that's could she has not been able to eat anything. Past Medical History  Diagnosis Date  . Asthma   . 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 Tonsillectomy     No family history on file. Social History  Substance Use Topics  . Smoking status: Never Smoker   . Smokeless tobacco: Never Used  . Alcohol Use: No   OB History    No data available     Review of Systems  All other systems reviewed and are negative.     Allergies  Penicillins  Home Medications   Prior to Admission medications   Medication Sig Start Date End Date Taking? Authorizing Provider  albuterol (PROVENTIL HFA;VENTOLIN HFA) 108 (90 BASE) MCG/ACT inhaler Inhale 1-2 puffs into the lungs every 4 (four) hours as needed for wheezing or shortness of breath. 07/31/15  Yes Stevi Barrett, PA-C  aspirin-acetaminophen-caffeine (EXCEDRIN MIGRAINE) 250-250-65 MG tablet Take 2 tablets by  mouth every 4 (four) hours as needed for headache.   Yes Historical Provider, MD  camphor-menthol Wynelle Fanny) lotion Apply topically 2 (two) times daily. Neck 01/01/16  Yes Oneta Rack, NP  hydrOXYzine (ATARAX/VISTARIL) 25 MG tablet Take 1 tablet (25 mg total) by mouth every 6 (six) hours as needed for anxiety. 01/01/16  Yes Sanjuana Kava, NP  ibuprofen (ADVIL,MOTRIN) 200 MG tablet Take 200 mg by mouth every 6 (six) hours as needed for moderate pain.   Yes Historical Provider, MD  mirtazapine (REMERON) 15 MG tablet Take 1 tablet (15 mg total) by mouth at bedtime. 01/01/16  Yes Oneta Rack, NP  Phenyleph-Diphenhyd-DM-APAP (FLU FORMULA PO) Take 30 mLs by mouth daily as needed (flu like symptoms).   Yes Historical Provider, MD   BP 147/93 mmHg  Pulse 75  Temp(Src) 99 F (37.2 C) (Oral)  Resp 16  SpO2 100%  LMP 01/15/2016 (Exact Date) Physical Exam  Constitutional: She appears well-developed and well-nourished. No distress.  HENT:  Head: Normocephalic and atraumatic.  Right Ear: External ear normal.  Left Ear: External ear normal.  Eyes: Conjunctivae are normal. Right eye exhibits no discharge. Left eye exhibits no discharge. No scleral icterus.  Neck: Neck supple. No tracheal deviation present.  Cardiovascular: Normal rate, regular rhythm and intact distal pulses.   Pulmonary/Chest: Effort normal and breath sounds normal. No stridor. No respiratory distress. She has  no wheezes. She has no rales.  Abdominal: Soft. Bowel sounds are normal. She exhibits no distension. There is no tenderness. There is no rebound and no guarding.  Musculoskeletal: She exhibits no edema or tenderness.  Neurological: She is alert. She has normal strength. No cranial nerve deficit (no facial droop, extraocular movements intact, no slurred speech) or sensory deficit. She exhibits normal muscle tone. She displays no seizure activity. Coordination normal.  Skin: Skin is warm and dry. No rash noted.  Psychiatric: She has a  normal mood and affect.  Nursing note and vitals reviewed.   ED Course  .Lumbar Puncture Date/Time: 01/17/2016 1:42 PM Performed by: Linwood Dibbles Authorized by: Linwood Dibbles Consent: The procedure was performed in an emergent situation. Verbal consent obtained. Risks and benefits: risks, benefits and alternatives were discussed Consent given by: patient Indications: evaluation for infection Anesthesia: local infiltration Local anesthetic: lidocaine 1% without epinephrine Anesthetic total: 5 ml Patient sedated: no Preparation: Patient was prepped and draped in the usual sterile fashion. Lumbar space: L3-L4 interspace Patient's position: left lateral decubitus Needle gauge: 20 Needle length: 3.5 in Number of attempts: 2 Comments: Unsuccessful attempt, will request fluoroscopy guided procedure    Medications  lidocaine (XYLOCAINE) 1 % (with pres) injection (not administered)  ketorolac (TORADOL) 30 MG/ML injection 30 mg (not administered)  acetaminophen (TYLENOL) tablet 650 mg (650 mg Oral Given 01/17/16 1000)  sodium chloride 0.9 % bolus 1,000 mL (0 mLs Intravenous Stopped 01/17/16 1205)    And  sodium chloride 0.9 % bolus 1,000 mL (0 mLs Intravenous Stopped 01/17/16 1507)    And  sodium chloride 0.9 % bolus 250 mL (0 mLs Intravenous Stopped 01/17/16 1053)  morphine 4 MG/ML injection 4 mg (4 mg Intravenous Given 01/17/16 1047)  morphine 4 MG/ML injection 4 mg (4 mg Intravenous Given 01/17/16 1257)     Labs Review Labs Reviewed  COMPREHENSIVE METABOLIC PANEL - Abnormal; Notable for the following:    Sodium 133 (*)    Potassium 3.4 (*)    Glucose, Bld 128 (*)    Calcium 8.4 (*)    ALT 13 (*)    All other components within normal limits  CBC WITH DIFFERENTIAL/PLATELET - Abnormal; Notable for the following:    Hemoglobin 11.0 (*)    HCT 34.9 (*)    RDW 17.3 (*)    All other components within normal limits  URINALYSIS, ROUTINE W REFLEX MICROSCOPIC (NOT AT [facility]) - Abnormal; Notable  for the following:    APPearance CLOUDY (*)    Specific Gravity, Urine 1.035 (*)    Hgb urine dipstick TRACE (*)    Protein, ur 30 (*)    All other components within normal limits  URINE MICROSCOPIC-ADD ON - Abnormal; Notable for the following:    Squamous Epithelial / LPF 6-30 (*)    Bacteria, UA MANY (*)    All other components within normal limits  CULTURE, BLOOD (ROUTINE X 2)  CULTURE, BLOOD (ROUTINE X 2)  URINE CULTURE  CSF CULTURE  GRAM STAIN  ROCKY MTN SPOTTED FVR ABS PNL(IGG+IGM)  CSF CELL COUNT WITH DIFFERENTIAL  CSF CELL COUNT WITH DIFFERENTIAL  GLUCOSE, CSF  PROTEIN, CSF  I-STAT CG4 LACTIC ACID, ED  I-STAT BETA HCG BLOOD, ED ([facility], [facility], [facility] ONLY)  I-STAT CG4 LACTIC ACID, ED    Imaging Review Dg Chest 2 View  01/17/2016  CLINICAL DATA:  Fever and cough EXAM: CHEST  2 VIEW COMPARISON:  07/31/2015 FINDINGS: The heart size and mediastinal contours are  within normal limits. Both lungs are clear. The visualized skeletal structures are unremarkable. IMPRESSION: No active cardiopulmonary disease. Electronically Signed   By: Alcide CleverMark  Lukens M.D.   On: 01/17/2016 11:21   Ct Head Wo Contrast  01/17/2016  CLINICAL DATA:  Headache, fever, body aches, and cough for few days. EXAM: CT HEAD WITHOUT CONTRAST TECHNIQUE: Contiguous axial images were obtained from the base of the skull through the vertex without intravenous contrast. COMPARISON:  None. FINDINGS: There is no evidence of acute cortical infarct, intracranial hemorrhage, mass, midline shift, or extra-axial fluid collection. Ventricles and sulci are normal in size. A 3 mm hypodense focus in the left occipital lobe (series 2, image 15) is felt to reflect slight asymmetry of the left occipital horn, of no clinical significance. Visualized orbits are unremarkable. Minimal bilateral ethmoid air cell mucosal thickening is partially visualized. The mastoid air cells are clear. No acute osseous abnormality is seen. IMPRESSION: Negative head CT.  Electronically Signed   By: Sebastian AcheAllen  Grady M.D.   On: 01/17/2016 11:31   Dg Lumbar Puncture Fluoro Guide  01/17/2016  CLINICAL DATA:  Fever and headache.  Possible meningitis. EXAM: DIAGNOSTIC LUMBAR PUNCTURE UNDER FLUOROSCOPIC GUIDANCE FLUOROSCOPY TIME:  Radiation Exposure Index (as provided by the fluoroscopic device): 38.5 mGy If the device does not provide the exposure index: Fluoroscopy Time (in minutes and seconds): Number of Acquired Images:  None. PROCEDURE: Informed consent was obtained from the patient prior to the procedure, including potential complications of headache, allergy, and pain. With the patient prone, the lower back was prepped with Betadine. 1% Lidocaine was used for local anesthesia. Lumbar puncture was performed at the L3-4 and L4-5 levels using a 28 gauge needle. Unfortunately, no CSF was obtained and the patient asked to terminate the procedure. IMPRESSION: Unsuccessful fluoroscopically guided lumbar puncture. This was discussed with Dr. Lynelle DoctorKnapp prior to dictation. Electronically Signed   By: Leanna BattlesMelinda  Blietz M.D.   On: 01/17/2016 15:08   I have personally reviewed and evaluated these images and lab results as part of my medical decision-making.   MDM   Final diagnoses:  Fever  Headache   Pt presents with fever and headache.  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    She presents with diffuse pain and fever.  No focal joint inflammation noted but she does have a ha and i am concerned about possible meningitis. No other signs of infection found.  Unable to get spinal fluid on bedside attempt.  Pt is having difficulty being still, she cannot get comfortable.  Will request fluro guided lp.  It is possible sx could be related to JRA flare  Radiology was unable to get the LP.  Pt's headache is improving.  I cannot rule out meningitis but her complaint seems to be focused more on her diffuse body pain.  She does not have meningismus.   Will consult with medical service for admission and further evaluation.    Linwood DibblesJon Iceis Knab, MD 01/17/16 1517  Linwood DibblesJon Kahne Helfand, MD 01/17/16 [phone]

## 2016-01-17 NOTE — ED Notes (Signed)
Patient transported to [facility]

## 2016-01-18 ENCOUNTER — Inpatient Hospital Stay (HOSPITAL_COMMUNITY): Payer: Self-pay

## 2016-01-18 ENCOUNTER — Encounter (HOSPITAL_COMMUNITY): Payer: Self-pay

## 2016-01-18 DIAGNOSIS — R109 Unspecified abdominal pain: Secondary | ICD-10-CM

## 2016-01-18 DIAGNOSIS — M2550 Pain in unspecified joint: Secondary | ICD-10-CM | POA: Insufficient documentation

## 2016-01-18 DIAGNOSIS — M791 Myalgia, unspecified site: Secondary | ICD-10-CM | POA: Insufficient documentation

## 2016-01-18 LAB — CBC
HCT: 32.6 % — ABNORMAL LOW (ref 36.0–46.0)
HEMOGLOBIN: 10.4 g/dL — AB (ref 12.0–15.0)
MCH: 26.6 pg (ref 26.0–34.0)
MCHC: 31.9 g/dL (ref 30.0–36.0)
MCV: 83.4 fL (ref 78.0–100.0)
PLATELETS: 260 10*3/uL (ref 150–400)
RBC: 3.91 MIL/uL (ref 3.87–5.11)
RDW: 17.3 % — AB (ref 11.5–15.5)
WBC: 6.8 10*3/uL (ref 4.0–10.5)

## 2016-01-18 LAB — URINE CULTURE

## 2016-01-18 LAB — BASIC METABOLIC PANEL
ANION GAP: 6 (ref 5–15)
CALCIUM: 8.2 mg/dL — AB (ref 8.9–10.3)
CO2: 22 mmol/L (ref 22–32)
CREATININE: 0.84 mg/dL (ref 0.44–1.00)
Chloride: 104 mmol/L (ref 101–111)
GFR calc Af Amer: >60 mL/min (ref >60)
GLUCOSE: 151 mg/dL — AB (ref 65–99)
Potassium: 3.5 mmol/L (ref 3.5–5.1)
Sodium: 132 mmol/L — ABNORMAL LOW (ref 135–145)

## 2016-01-18 LAB — INFLUENZA PANEL BY PCR (TYPE A & B)
H1N1 flu by pcr: NOT DETECTED
INFLAPCR: NEGATIVE
INFLBPCR: NEGATIVE

## 2016-01-18 LAB — HIV ANTIBODY (ROUTINE TESTING W REFLEX): HIV Screen 4th Generation wRfx: NONREACTIVE

## 2016-01-18 LAB — ROCKY MTN SPOTTED FVR ABS PNL(IGG+IGM)
RMSF IgG: NEGATIVE
RMSF IgM: 0.24 index (ref 0.00–0.89)

## 2016-01-18 LAB — RHEUMATOID FACTOR: Rhuematoid fact SerPl-aCnc: <10 IU/mL (ref 0.0–13.9)

## 2016-01-18 LAB — FERRITIN: FERRITIN: 8 ng/mL — AB (ref 11–307)

## 2016-01-18 LAB — CK: CK TOTAL: 113 U/L (ref 38–234)

## 2016-01-18 MED ORDER — GADOBENATE DIMEGLUMINE 529 MG/ML IV SOLN
15.0000 mL | Freq: Once | INTRAVENOUS | Status: AC | PRN
  Administered 2016-01-18: 15 mL via INTRAVENOUS

## 2016-01-18 MED ORDER — TRAMADOL HCL 50 MG PO TABS
50.0000 mg | ORAL_TABLET | Freq: Four times a day (QID) | ORAL | Status: DC | PRN
  Filled 2016-01-18: qty 1

## 2016-01-18 MED ORDER — OXYCODONE HCL 5 MG PO TABS
5.0000 mg | ORAL_TABLET | ORAL | Status: AC
  Administered 2016-01-18: 5 mg via ORAL
  Filled 2016-01-18: qty 1

## 2016-01-18 MED ORDER — POTASSIUM PHOSPHATES 15 MMOLE/5ML IV SOLN: 20.0000 mmol | Freq: Once | INTRAVENOUS | Status: DC

## 2016-01-18 MED ORDER — LORAZEPAM 1 MG PO TABS
1.0000 mg | ORAL_TABLET | Freq: Once | ORAL | Status: AC
  Administered 2016-01-18: 1 mg via ORAL
  Filled 2016-01-18: qty 1

## 2016-01-18 MED ORDER — OXYCODONE HCL 5 MG PO TABS
5.0000 mg | ORAL_TABLET | Freq: Four times a day (QID) | ORAL | Status: DC | PRN
  Administered 2016-01-19 – 2016-01-20 (×5): 10 mg via ORAL
  Filled 2016-01-18 (×5): qty 2

## 2016-01-18 MED ORDER — MORPHINE SULFATE (PF) 2 MG/ML IV SOLN
2.0000 mg | Freq: Four times a day (QID) | INTRAVENOUS | Status: DC | PRN
Start: 2016-01-18 — End: 2016-01-20
  Administered 2016-01-19: 2 mg via INTRAVENOUS
  Filled 2016-01-18 (×2): qty 1

## 2016-01-18 NOTE — Consult Note (Addendum)
Date of Admission:  01/17/2016  Date of Consult:  01/18/2016  Reason for Consult: FUO  Referring Physician: Dr. Dyann Kief   HPI: Terri Stafford is an 20 y.o. female 20 y.o. 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 She then became pregnant and was taken off all of her medications. 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 She had done relatively well other than a few flare ups of joint inflammation here and there that came and went.   She is in school at [employer], is from [place] where her Mom and her child live. She normally lives with her roommate at [facility]. She was visiting now that school is over for a meeting when last week she began to notice intense pain on the right side of her body on Wednesday or Thursday last week. She then developed HA around her entire head with nausea. Over the weekend, on Sunday into Monday she began having high fevers and intense myalgias in arms, legs, pain in her neck where her lymph nodes were enlarged. She also had intense abdominal pain yesterday. She has no sick contacts. No tick exposures, dogs, pets.   She was seen in ED and LP was attempted by ED staff and IR unsuccessfully.   Her temperature here has been up to 103 on admission and again this am. Her CBC on admission was normal other than mild normocytic anemia (she had bit of thromobcytosis in late April). Her ESR was normal at 19. CMP only showed slight low Na at 133. LFTs normal CK normal, CRP normal, Procalcitonin normal. LA normal. RF negative. HIV negative. Influenza negative. Mono spot negative.  CT abdomen with limited oral contrast was unrevealing though a limited study.  This am she has continued headache and intense myalgias in arms and esp her thighs. No rash.    UA negative, CXR  negative.    Past Medical History  Diagnosis Date  . Asthma   . 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 MDD (major depressive disorder) [facility])     Past Surgical History  Procedure Laterality Date  . Tonsillectomy      Social History:  reports that she has never smoked. She has never used smokeless tobacco. She reports that she does not drink alcohol or use illicit drugs.   History reviewed. No pertinent family history.  Allergies  Allergen Reactions  . Penicillins Hives     Has patient had a PCN reaction causing immediate rash, facial/tongue/throat swelling, SOB or lightheadedness with hypotension: No Has patient had a PCN reaction causing severe rash involving mucus membranes or skin necrosis: No Has patient had a PCN reaction that required hospitalization-yes  Has patient had a PCN reaction occurring within the last 10 years: No If all of the above answers are "NO", then may proceed with Cephalosporin use.      Medications: I have reviewed patients current medications as documented in Epic Anti-infectives    None         ROS: as in HPI otherwise remainder of 12 point Review of Systems is negative  Blood pressure 115/61, pulse 94, temperature 101.2 F (38.4 C), temperature source Oral, resp. rate 20, height '5\' 3"'  (1.6 m), weight 159 lb 2.8 oz (  72.2 kg), last menstrual period 01/15/2016, SpO2 98 %. General: Alert and awake, oriented x3, not in any acute distress. But appears uncomfortable with moving too quickly in bed HEENT: anicteric sclera,  EOMI, oropharynx clear and tongue with subtle coating. I cannot visualize the posterior oropharynx  Cardiovascular: tachy rate, normal r,  no murmur rubs or gallops Pulmonary: clear to auscultation bilaterally, no wheezing, rales or rhonchi Gastrointestinal: soft nontender, nondistended, normal bowel sounds, Musculoskeletal: no  clubbing or edema she has tenderness to palpation of thighs >> arms Skin,  soft tissue: no rashes Neuro: nonfocal, strength and sensation intact   Results for orders placed or performed during the hospital encounter of 01/17/16 (from the past 48 hour(s))  Comprehensive metabolic panel     Status: Abnormal   Collection Time: 01/17/16 10:33 AM  Result Value Ref Range   Sodium 133 (L) 135 - 145 mmol/L   Potassium 3.4 (L) 3.5 - 5.1 mmol/L   Chloride 101 101 - 111 mmol/L   CO2 23 22 - 32 mmol/L   Glucose, Bld 128 (H) 65 - 99 mg/dL   BUN 10 6 - 20 mg/dL   Creatinine, Ser 0.79 0.44 - 1.00 mg/dL   Calcium 8.4 (L) 8.9 - 10.3 mg/dL   Total Protein 7.3 6.5 - 8.1 g/dL   Albumin 4.0 3.5 - 5.0 g/dL   AST 17 15 - 41 U/L   ALT 13 (L) 14 - 54 U/L   Alkaline Phosphatase 46 38 - 126 U/L   Total Bilirubin 0.7 0.3 - 1.2 mg/dL   GFR calc non Af Amer >60 >60 mL/min   GFR calc Af Amer >60 >60 mL/min    Comment: (NOTE) The eGFR has been calculated using the CKD EPI equation. This calculation has not been validated in all clinical situations. eGFR's persistently <60 mL/min signify possible Chronic Kidney Disease.    Anion gap 9 5 - 15  CBC WITH DIFFERENTIAL     Status: Abnormal   Collection Time: 01/17/16 10:33 AM  Result Value Ref Range   WBC 7.5 4.0 - 10.5 K/uL   RBC 4.21 3.87 - 5.11 MIL/uL   Hemoglobin 11.0 (L) 12.0 - 15.0 g/dL   HCT 34.9 (L) 36.0 - 46.0 %   MCV 82.9 78.0 - 100.0 fL   MCH 26.1 26.0 - 34.0 pg   MCHC 31.5 30.0 - 36.0 g/dL   RDW 17.3 (H) 11.5 - 15.5 %   Platelets 274 150 - 400 K/uL   Neutrophils Relative % 58 %   Neutro Abs 4.4 1.7 - 7.7 K/uL   Lymphocytes Relative 30 %   Lymphs Abs 2.2 0.7 - 4.0 K/uL   Monocytes Relative 9 %   Monocytes Absolute 0.7 0.1 - 1.0 K/uL   Eosinophils Relative 3 %   Eosinophils Absolute 0.2 0.0 - 0.7 K/uL   Basophils Relative 0 %   Basophils Absolute 0.0 0.0 - 0.1 K/uL  I-Stat Beta hCG blood, ED ([facility], [facility], [facility] only)     Status: None   Collection Time: 01/17/16 10:51 AM  Result Value Ref Range   I-stat hCG, quantitative  <5.0 <5 mIU/mL   Comment 3            Comment:   GEST. AGE      CONC.  (mIU/mL)   <=1 WEEK        5 - 50     2 WEEKS       50 - 500     3  WEEKS       100 - 10,000     4 WEEKS     1,000 - 30,000        FEMALE AND NON-PREGNANT FEMALE:     LESS THAN 5 mIU/mL   I-Stat CG4 Lactic Acid, ED  (not at  [facility])     Status: None   Collection Time: 01/17/16 10:52 AM  Result Value Ref Range   Lactic Acid, Venous 1.41 0.5 - 2.0 mmol/L  Urinalysis, Routine w reflex microscopic (not at [facility])     Status: Abnormal   Collection Time: 01/17/16 11:00 AM  Result Value Ref Range   Color, Urine YELLOW YELLOW   APPearance CLOUDY (A) CLEAR   Specific Gravity, Urine 1.035 (H) 1.005 - 1.030   pH 6.0 5.0 - 8.0   Glucose, UA NEGATIVE NEGATIVE mg/dL   Hgb urine dipstick TRACE (A) NEGATIVE   Bilirubin Urine NEGATIVE NEGATIVE   Ketones, ur NEGATIVE NEGATIVE mg/dL   Protein, ur 30 (A) NEGATIVE mg/dL   Nitrite NEGATIVE NEGATIVE   Leukocytes, UA NEGATIVE NEGATIVE  Urine microscopic-add on     Status: Abnormal   Collection Time: 01/17/16 11:00 AM  Result Value Ref Range   Squamous Epithelial / LPF 6-30 (A) NONE SEEN   WBC, UA 0-5 0 - 5 WBC/hpf   RBC / HPF 0-5 0 - 5 RBC/hpf   Bacteria, UA MANY (A) NONE SEEN   Urine-Other MUCOUS PRESENT   Rheumatoid factor     Status: None   Collection Time: 01/17/16  3:33 PM  Result Value Ref Range   Rhuematoid fact SerPl-aCnc <10.0 0.0 - 13.9 IU/mL    Comment: (NOTE) Performed At: [facility] [place], [place] [id number] Lindon Romp MD [id number]   Sedimentation rate     Status: None   Collection Time: 01/17/16  3:33 PM  Result Value Ref Range   Sed Rate 19 0 - 22 mm/hr  C-reactive protein     Status: None   Collection Time: 01/17/16  3:33 PM  Result Value Ref Range   CRP <0.5 <1.0 mg/dL    Comment: Performed at [place] Lactic Acid, ED  (not at  [facility])     Status: None   Collection Time: 01/17/16  3:53 PM  Result  Value Ref Range   Lactic Acid, Venous 0.98 0.5 - 2.0 mmol/L  HIV antibody     Status: None   Collection Time: 01/17/16  6:19 PM  Result Value Ref Range   HIV Screen 4th Generation wRfx Non Reactive Non Reactive    Comment: (NOTE) Performed At: [facility] [address] [place], [place] [id number] Lindon Romp MD TO:[id number]   Lipase, blood     Status: None   Collection Time: 01/17/16  6:19 PM  Result Value Ref Range   Lipase 17 11 - 51 U/L  Magnesium     Status: None   Collection Time: 01/17/16  8:04 PM  Result Value Ref Range   Magnesium 2.0 1.7 - 2.4 mg/dL  Phosphorus     Status: Abnormal   Collection Time: 01/17/16  8:04 PM  Result Value Ref Range   Phosphorus 2.4 (L) 2.5 - 4.6 mg/dL  Mononucleosis screen     Status: None   Collection Time: 01/17/16  8:04 PM  Result Value Ref Range   Mono Screen NEGATIVE NEGATIVE  Procalcitonin - Baseline     Status: None   Collection Time: 01/17/16  8:04 PM  Result Value Ref Range   Procalcitonin <0.10 ng/mL    Comment:        Interpretation: PCT (Procalcitonin) <= 0.5 ng/mL: Systemic infection (sepsis) is not likely. Local bacterial infection is possible. (NOTE)         ICU PCT Algorithm               Non ICU PCT Algorithm    ----------------------------     ------------------------------         PCT < 0.25 ng/mL                 PCT < 0.1 ng/mL     Stopping of antibiotics            Stopping of antibiotics       strongly encouraged.               strongly encouraged.    ----------------------------     ------------------------------       PCT level decrease by               PCT < 0.25 ng/mL       >= 80% from peak PCT       OR PCT 0.25 - 0.5 ng/mL          Stopping of antibiotics                                             encouraged.     Stopping of antibiotics           encouraged.    ----------------------------     ------------------------------       PCT level decrease by              PCT >= 0.25 ng/mL        < 80% from peak PCT        AND PCT >= 0.5 ng/mL            Continuin g antibiotics                                              encouraged.       Continuing antibiotics            encouraged.    ----------------------------     ------------------------------     PCT level increase compared          PCT > 0.5 ng/mL         with peak PCT AND          PCT >= 0.5 ng/mL             Escalation of antibiotics                                          strongly encouraged.      Escalation of antibiotics        strongly encouraged.   Influenza panel by PCR (type A & B, H1N1)     Status: None   Collection Time: 01/17/16 10:57 PM  Result Value Ref Range   Influenza A By PCR NEGATIVE NEGATIVE  Influenza B By PCR NEGATIVE NEGATIVE   H1N1 flu by pcr NOT DETECTED NOT DETECTED    Comment:        The Xpert Flu assay (FDA approved for nasal aspirates or washes and nasopharyngeal swab specimens), is intended as an aid in the diagnosis of influenza and should not be used as a sole basis for treatment. Performed at [place] metabolic panel     Status: Abnormal   Collection Time: 01/18/16  5:42 AM  Result Value Ref Range   Sodium 132 (L) 135 - 145 mmol/L   Potassium 3.5 3.5 - 5.1 mmol/L   Chloride 104 101 - 111 mmol/L   CO2 22 22 - 32 mmol/L   Glucose, Bld 151 (H) 65 - 99 mg/dL   BUN <5 (L) 6 - 20 mg/dL   Creatinine, Ser 0.84 0.44 - 1.00 mg/dL   Calcium 8.2 (L) 8.9 - 10.3 mg/dL   GFR calc non Af Amer >60 >60 mL/min   GFR calc Af Amer >60 >60 mL/min    Comment: (NOTE) The eGFR has been calculated using the CKD EPI equation. This calculation has not been validated in all clinical situations. eGFR's persistently <60 mL/min signify possible Chronic Kidney Disease.    Anion gap 6 5 - 15  CBC     Status: Abnormal   Collection Time: 01/18/16  5:42 AM  Result Value Ref Range   WBC 6.8 4.0 - 10.5 K/uL   RBC 3.91 3.87 - 5.11 MIL/uL   Hemoglobin 10.4 (L) 12.0 - 15.0 g/dL   HCT  32.6 (L) 36.0 - 46.0 %   MCV 83.4 78.0 - 100.0 fL   MCH 26.6 26.0 - 34.0 pg   MCHC 31.9 30.0 - 36.0 g/dL   RDW 17.3 (H) 11.5 - 15.5 %   Platelets 260 150 - 400 K/uL  CK     Status: None   Collection Time: 01/18/16  5:42 AM  Result Value Ref Range   Total CK 113 38 - 234 U/L  Ferritin     Status: Abnormal   Collection Time: 01/18/16  5:42 AM  Result Value Ref Range   Ferritin 8 (L) 11 - 307 ng/mL    Comment: Performed at [facility]   '@BRIEFLABTABLE' (sdes,specrequest,cult,reptstatus)   )No results found for this or any previous visit (from the past 720 hour(s)).   Impression/Recommendation  Principal Problem:   FUO (fever of unknown origin) Active Problems:   MDD (major depressive disorder) (HCC)   Mild intermittent asthma   JRA (juvenile rheumatoid arthritis) (HCC)   Hypokalemia   Dehydration with hyponatremia   Terri Stafford is a 20 y.o. female with  Hx of JRA off treatment since 2015 with occasional flares of arthritis but otherwise with quiescent disease now with admission for high fevers, HA, myalgias, abdominal pain  #1 FUO:  Certainly given her history of JRA I would wonder if this is a flare of her known auto-immune condition  Certainly her clinical presentation, normal PCT, labs does not fit well with bacterial meningitis or other obvious bacterial infection.  She does not have evidence of an infectious septic arthritis. I doubt she has pyomyositis  I discussed this case with Dr. Amil Amen with [facility] and he agreed that this was a very odd case and that it did sound like it could either be a viral infection vs an auto-immune process.   He thought a trial of prednisone of 34m daily while in a  controlled setting such as the hospital was a reasonable diagnostic and therapeutic maneuver   At moment I would like to get some additional tests FIRSTt:  I will order an MRI of her brain int he meantime given the severity of her headaches to  ensure she does not have a brain abscess  I would consider repeat CT chest abdomen and pelvis though given negative CXR and negative initial CT abdomen I am not enthusiastic about the radiation exposure, cost and yield of these tests.  She has EBV, CMV serologies and Hep panel cooking. I will add parvovirus antibodies, coxsackie virus abs, HSV 1, 2 antibodies  I will send a respiratory virus panel and antibodies for WNV and arbovirus  Her HIV RNA is pending  I agree with TTE  Dr. Linus Salmons will be covering later this afternoon for questions and tomorrow will check in on the patient.  I spent greater than 80  minutes with the patient including greater than 50% of time in face to face counsel of the patient re her FUO workup, JRA  and in coordination of her care with Dr. Dyann Kief and Dr. Amil Amen.     01/18/2016, 9:31 AM   Thank you so much for this interesting consult  [facility] [place] [phone] (pager) [phone] (office) 01/18/2016, 9:31 AM  Rhina [place] 01/18/2016, 9:31 AM

## 2016-01-18 NOTE — Progress Notes (Signed)
[facility] PROGRESS NOTE  Terri Stafford KAJ:[id number] DOB: [birth year] DOA: 01/17/2016 PCP: No primary care provider on file.  Interim summary and HPI 20 y.o. female with PMH significant for JRA ( currently not taking any treatment) and uncomplicated asthma; who presented to ED complaining of fever, general malaise and myalgias. Symptoms has been present for the last 4-5 days and worsening. In the last 2 days patient has also reported some HA's and feeling sick in her stomach; reason why she has not been eating or drinking much. She denies any dysuria. She denies any diarrhea. Vaginal discharge or bleeding. Patient does not have a history of similar episodes in the past. She denies any recent trips or travel. No known ill contacts. No known tick bites. No rashes or petechiae. On exam she is dehydrated and her UA has elevated specific gravity.  Continue experiencing fever, complaining of myalgias and HA's. ID on board and assisting with work up for FUO. Will follow rec's. Holding abx's for now  Assessment/Plan: 1-FUO (fever of unknown origin): -etiology remains unclear -CXR neg, no abnormalities on UA and normal CT/MRI head -CT abd and pelvis w/o acute abnormalities, but unable to be done with PO contrast (patient become nauseated and couldn't take it) -neg influenza, HIV and mononucleosis; CRP not elevated and normal PCT -patient with normal ESR and normal WBC's -LP unsuccessful -will follow cultures and pending infectious work up as dictated by ID -continue PRN antipyretics, analgesics and supportive care -no antibiotics for now   2-MDD (major depressive disorder) (HCC)/anxiety: -not on any antidepressant currently -denies SI or hallucinations -continue PRN atarax   3-Mild intermittent asthma: -continue PRN duoneb -no wheezing and with good O2 sat  4-JRA (juvenile rheumatoid arthritis) ([place]): -was on methotrexate until approx 2 1/2 years ago -not taking any medication  currently and w/o insurance to follow with rheumatologist  -will try to assist with outpatient follow up at discharge -Dr. Tommy Medal will discussed with Dr. Amil Amen for further rec's and discussed empiric steroids use  5-Hypokalemia: due to poor intake most likely -will replete as needed  -Mg and PO4 essentially WNL  6-Dehydration with hyponatremia: -will continue IVF's resuscitation -follow electrolytes trend; improving  7-migraine: -will continue topamax  Code Status: Full code Family Communication: no family at bedside Disposition Plan: remains inpatient; will follow pending work up and rec's from ID service.   Consultants:  ID  Procedures:  See below for x-ray reports  2-D echo: pending   Antibiotics:  None   HPI/Subjective: Continue spiking fever and complaining of generalized myalgias and HA's. No nausea or vomiting.   Objective: Filed Vitals:   01/18/16 2034 01/18/16 2149  BP: 106/51   Pulse: 111   Temp: 103.2 F (39.6 C) 99.4 F (37.4 C)  Resp: 19     Intake/Output Summary (Last 24 hours) at 01/18/16 2324 Last data filed at 01/18/16 1500  Gross per 24 hour  Intake    800 ml  Output      0 ml  Net    800 ml   Filed Weights   01/18/16 0212  Weight: 72.2 kg (159 lb 2.8 oz)    Exam:   General:  Continue spiking fever and complaining of HA's and myalgias. No nausea or vomiting.  Cardiovascular: S1 and S2, no rubs or gallops  Respiratory: no wheezing, no crackles, good O2 sat on RA  Abdomen: soft, NT, no distension appreciated. Positive BS  Musculoskeletal: no joint swelling; no cyanosis or clubbing   Data  Reviewed: Basic Metabolic Panel:  Recent Labs Lab 01/17/16 1033 01/17/16 2004 01/18/16 0542  NA 133*  --  132*  K 3.4*  --  3.5  CL 101  --  104  CO2 23  --  22  GLUCOSE 128*  --  151*  BUN 10  --  <5*  CREATININE 0.79  --  0.84  CALCIUM 8.4*  --  8.2*  MG  --  2.0  --   PHOS  --  2.4*  --    Liver Function Tests:  Recent  Labs Lab 01/17/16 1033  AST 17  ALT 13*  ALKPHOS 46  BILITOT 0.7  PROT 7.3  ALBUMIN 4.0    Recent Labs Lab 01/17/16 1819  LIPASE 17   CBC:  Recent Labs Lab 01/17/16 1033 01/18/16 0542  WBC 7.5 6.8  NEUTROABS 4.4  --   HGB 11.0* 10.4*  HCT 34.9* 32.6*  MCV 82.9 83.4  PLT 274 260   Cardiac Enzymes:  Recent Labs Lab 01/18/16 0542  CKTOTAL 113   CBG: No results for input(s): GLUCAP in the last 168 hours.  Recent Results (from the past 240 hour(s))  Blood Culture (routine x 2)     Status: None (Preliminary result)   Collection Time: 01/17/16 10:33 AM  Result Value Ref Range Status   Specimen Description BLOOD LEFT ANTECUBITAL  Final   Special Requests BOTTLES DRAWN AEROBIC AND ANAEROBIC 5ML  Final   Culture   Final    NO GROWTH 1 DAY Performed at [facility]    Report Status PENDING  Incomplete  Urine culture     Status: Abnormal   Collection Time: 01/17/16 11:00 AM  Result Value Ref Range Status   Specimen Description URINE, CLEAN CATCH  Final   Special Requests NONE  Final   Culture MULTIPLE SPECIES PRESENT, SUGGEST RECOLLECTION (A)  Final   Report Status 01/18/2016 FINAL  Final  Blood Culture (routine x 2)     Status: None (Preliminary result)   Collection Time: 01/17/16 11:25 AM  Result Value Ref Range Status   Specimen Description BLOOD RIGHT ANTECUBITAL  Final   Special Requests BOTTLES DRAWN AEROBIC AND ANAEROBIC 5ML  Final   Culture   Final    NO GROWTH < 24 HOURS Performed at [facility]    Report Status PENDING  Incomplete     Studies: Dg Chest 2 View  01/17/2016  CLINICAL DATA:  Fever and cough EXAM: CHEST  2 VIEW COMPARISON:  07/31/2015 FINDINGS: The heart size and mediastinal contours are within normal limits. Both lungs are clear. The visualized skeletal structures are unremarkable. IMPRESSION: No active cardiopulmonary disease. Electronically Signed   By: Inez Catalina M.D.   On: 01/17/2016 11:21   Ct Head Wo  Contrast  01/17/2016  CLINICAL DATA:  Headache, fever, body aches, and cough for few days. EXAM: CT HEAD WITHOUT CONTRAST TECHNIQUE: Contiguous axial images were obtained from the base of the skull through the vertex without intravenous contrast. COMPARISON:  None. FINDINGS: There is no evidence of acute cortical infarct, intracranial hemorrhage, mass, midline shift, or extra-axial fluid collection. Ventricles and sulci are normal in size. A 3 mm hypodense focus in the left occipital lobe (series 2, image 15) is felt to reflect slight asymmetry of the left occipital horn, of no clinical significance. Visualized orbits are unremarkable. Minimal bilateral ethmoid air cell mucosal thickening is partially visualized. The mastoid air cells are clear. No acute osseous abnormality is seen.  IMPRESSION: Negative head CT. Electronically Signed   By: Logan Bores M.D.   On: 01/17/2016 11:31   Mr Jeri Cos TD Contrast  01/18/2016  CLINICAL DATA:  Headache. Intense pain on right side of body. Evaluate for brain abscess. EXAM: MRI HEAD WITHOUT AND WITH CONTRAST TECHNIQUE: Multiplanar, multiecho pulse sequences of the brain and surrounding structures were obtained without and with intravenous contrast. CONTRAST:  31m MULTIHANCE GADOBENATE DIMEGLUMINE 529 MG/ML IV SOLN COMPARISON:  Head CT from yesterday FINDINGS: Calvarium and upper cervical spine: Negative. Enlarged bilateral cervical lymph nodes in the upper cervical chains. Orbits: Negative. Sinuses and Mastoids: Small presumed mucous retention cysts in the left maxillary antrum with mild scattered mucosal thickening in maxillary and ethmoid sinuses bilaterally. No acute sinusitis to explain history of fevers. Brain: Negative for brain abscess. No abnormal intracranial enhancement or sulcal/cisternal FLAIR hyperintensity to suggest meningitis. No brain edema or infarct. Approximately 6 mm T1 hyperintense comparatively non or hypoenhancing structure in the posterior sella  favoring Rathke's cleft cyst. IMPRESSION: 1. Negative for cerebral abscess. No sign of intracranial infection. 2. Nonspecific bilateral cervical adenopathy, presumably related to patient's systemic process. 3. 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 Electronically Signed   By: JMonte FantasiaM.D.   On: 01/18/2016 12:43   Ct Abdomen Pelvis W Contrast  01/17/2016  CLINICAL DATA:  20year old female with fever and headache. Diffuse body aches. EXAM: CT ABDOMEN AND PELVIS WITH CONTRAST TECHNIQUE: Multidetector CT imaging of the abdomen and pelvis was performed using the standard protocol following bolus administration of intravenous contrast. CONTRAST:  1060mISOVUE-300 IOPAMIDOL (ISOVUE-300) INJECTION 61% COMPARISON:  None. FINDINGS: The visualized lung bases are clear. No intra-abdominal free air or free fluid. The liver, gallbladder, pancreas, spleen, adrenal glands, kidneys, visualized ureters, and urinary bladder appear unremarkable. The uterus and ovaries are grossly unremarkable. Evaluation of the bowel is limited in the absence of oral contrast. There is no evidence of bowel obstruction or active inflammation. Normal caliber fluid-filled loops of small bowel, likely physiologic. Enteritis is less likely. Clinical correlation is recommended. Normal appendix. The abdominal aorta and IVC appear unremarkable. The origins of the celiac axis, SMA, IMA as well as the origins of the renal arteries appear patent. No portal venous gas identified. There is no adenopathy. The abdominal wall soft tissues appear unremarkable. 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 No acute fracture. IMPRESSION: No acute intra-abdominal pelvic pathology. Electronically Signed   By: ArAnner Crete.D.   On: 01/17/2016 22:01   Dg Lumbar Puncture Fluoro Guide  01/17/2016  CLINICAL DATA:  Fever and headache.  Possible meningitis. EXAM: DIAGNOSTIC LUMBAR PUNCTURE UNDER FLUOROSCOPIC GUIDANCE FLUOROSCOPY TIME:  Radiation Exposure Index  (as provided by the fluoroscopic device): 38.5 mGy If the device does not provide the exposure index: Fluoroscopy Time (in minutes and seconds): Number of Acquired Images:  None. PROCEDURE: Informed consent was obtained from the patient prior to the procedure, including potential complications of headache, allergy, and pain. With the patient prone, the lower back was prepped with Betadine. 1% Lidocaine was used for local anesthesia. Lumbar puncture was performed at the L3-4 and L4-5 levels using a 28 gauge needle. Unfortunately, no CSF was obtained and the patient asked to terminate the procedure. IMPRESSION: Unsuccessful fluoroscopically guided lumbar puncture. This was discussed with Dr. KnTomi Bambergerrior to dictation. Electronically Signed   By: MeLorin Picket.D.   On: 01/17/2016 15:08    Scheduled Meds: . heparin  5,000 Units Subcutaneous Q8H  . loratadine  10 mg Oral Daily  . mirtazapine  15 mg Oral QHS  . pantoprazole  40 mg Oral Daily  . topiramate  50 mg Oral BID   Continuous Infusions:   Principal Problem:   FUO (fever of unknown origin) Active Problems:   MDD (major depressive disorder) (HCC)   Mild intermittent asthma   JRA (juvenile rheumatoid arthritis) ([place])   Hypokalemia   Dehydration with hyponatremia   Arthralgia   Myalgia    Time spent: 35 minutes    Barton Dubois  [facility] Pager [phone]. If 7PM-7AM, please contact night-coverage at [email or web address], password [facility] 01/18/2016, 11:24 PM  LOS: 0 days

## 2016-01-18 NOTE — Progress Notes (Signed)
IV infiltrated. IV team attempted to start IV x2, unsuccessful. IV RN reccommended either placing a PICC or switching all meds to PO. Night on call MD notified, stated will let attending determine if PICC is appropriate.

## 2016-01-19 ENCOUNTER — Inpatient Hospital Stay (HOSPITAL_COMMUNITY): Payer: MEDICAID

## 2016-01-19 DIAGNOSIS — R509 Fever, unspecified: Secondary | ICD-10-CM

## 2016-01-19 DIAGNOSIS — Z8739 Personal history of other diseases of the musculoskeletal system and connective tissue: Secondary | ICD-10-CM

## 2016-01-19 LAB — EHRLICHIA ANTIBODY PANEL
E CHAFFEENSIS AB, IGM: NEGATIVE
E chaffeensis (HGE) Ab, IgG: NEGATIVE
E. CHAFFEENSIS IGG AB: NEGATIVE
E. Chaffeensis (HME) IgM Titer: NEGATIVE

## 2016-01-19 LAB — HEPATITIS PANEL, ACUTE
HCV AB: 0.2 {s_co_ratio} (ref 0.0–0.9)
HEP B S AG: NEGATIVE
Hep A IgM: NEGATIVE
Hep B C IgM: NEGATIVE

## 2016-01-19 LAB — BASIC METABOLIC PANEL
Anion gap: 7 (ref 5–15)
BUN: 9 mg/dL (ref 6–20)
CALCIUM: 8.8 mg/dL — AB (ref 8.9–10.3)
CHLORIDE: 107 mmol/L (ref 101–111)
CO2: 21 mmol/L — ABNORMAL LOW (ref 22–32)
CREATININE: 0.84 mg/dL (ref 0.44–1.00)
Glucose, Bld: 93 mg/dL (ref 65–99)
Potassium: 3.7 mmol/L (ref 3.5–5.1)
SODIUM: 135 mmol/L (ref 135–145)

## 2016-01-19 LAB — COXSACKIE A VIRUS ANTIBODIES
Coxsackie A16 IgG: 1:1600 {titer} — ABNORMAL HIGH
Coxsackie A16 IgM: NEGATIVE titer
Coxsackie A24 IgG: 1:1600 {titer} — ABNORMAL HIGH
Coxsackie A24 IgM: NEGATIVE titer
Coxsackie A7 IgM: NEGATIVE titer
Coxsackie A9 IgM: NEGATIVE titer

## 2016-01-19 LAB — ECHOCARDIOGRAM COMPLETE
HEIGHTINCHES: 63 in
WEIGHTICAEL: 2546.75 [oz_av]

## 2016-01-19 LAB — CMV ANTIBODY, IGG (EIA): CMV Ab - IgG: 3.9 U/mL — ABNORMAL HIGH (ref 0.00–0.59)

## 2016-01-19 LAB — EPSTEIN-BARR VIRUS VCA ANTIBODY PANEL
EBV Early Antigen Ab, IgG: <9 U/mL (ref 0.0–8.9)
EBV NA IgG: >600 U/mL — ABNORMAL HIGH (ref 0.0–17.9)
EBV VCA IGG: 191 U/mL — AB (ref 0.0–17.9)
EBV VCA IgM: <36 U/mL (ref 0.0–35.9)

## 2016-01-19 LAB — HEMOGLOBIN A1C
HEMOGLOBIN A1C: 5.7 % — AB (ref 4.8–5.6)
Mean Plasma Glucose: 117 mg/dL

## 2016-01-19 LAB — PROCALCITONIN

## 2016-01-19 LAB — HIV-1 RNA QUANT-NO REFLEX-BLD: LOG10 HIV-1 RNA: UNDETERMINED {Log_copies}/mL

## 2016-01-19 LAB — ROCKY MTN SPOTTED FVR ABS PNL(IGG+IGM)
RMSF IgG: NEGATIVE
RMSF IgM: 0.24 index (ref 0.00–0.89)

## 2016-01-19 LAB — ANGIOTENSIN CONVERTING ENZYME: ANGIOTENSIN-CONVERTING ENZYME: 17 U/L (ref 14–82)

## 2016-01-19 LAB — ANTINUCLEAR ANTIBODIES, IFA: ANA Ab, IFA: NEGATIVE

## 2016-01-19 LAB — HSV(HERPES SIMPLEX VRS) I + II AB-IGG
HSV 1 Glycoprotein G Ab, IgG: 31.7 index — ABNORMAL HIGH (ref 0.00–0.90)
HSV 2 GLYCOPROTEIN G AB, IGG: 1.82 {index} — AB (ref 0.00–0.90)

## 2016-01-19 LAB — CMV IGM: CMV IgM: <30 AU/mL (ref 0.0–29.9)

## 2016-01-19 LAB — PARVOVIRUS B19 ANTIBODY, IGG AND IGM
PAROVIRUS B19 IGG ABS: 0.5 {index} (ref 0.0–0.8)
PAROVIRUS B19 IGM ABS: 0.2 {index} (ref 0.0–0.8)

## 2016-01-19 LAB — RPR: RPR: NONREACTIVE

## 2016-01-19 MED ORDER — LIP MEDEX EX OINT
TOPICAL_OINTMENT | CUTANEOUS | Status: DC | PRN
  Administered 2016-01-19: 06:00:00 via TOPICAL
  Filled 2016-01-19: qty 7

## 2016-01-19 NOTE — Progress Notes (Addendum)
[facility] for Infectious Disease   Reason for visit: Follow up on fever  Interval History: fever this am, she feels she has had a fever today as well.  Work up so far is unrevealing including Echo, mono labs, MRI.  Complains of same headache "all over".  Says she can't see without her glasses.  Muscle aches in "all muscles".    Physical Exam: Constitutional:  Filed Vitals:   01/19/16 0416 01/19/16 1323  BP: 106/57 106/65  Pulse: 80 87  Temp: 100.5 F (38.1 C) 99 F (37.2 C)  Resp: 18 20   patient appears in some discomfort Respiratory: Normal respiratory effort; CTA B Cardiovascular: RRR GI: soft, no rebound no guarding, tenderness diffusely, normal bowel sounds MS: no joint swelling in hands, feet, knees, elbows Skin: no rash Neuro: non focal; no meningismus   Review of Systems: Constitutional: positive for fevers, fatigue and anorexia or negative for weight loss Integument/breast: negative for rash Musculoskeletal: negative for arthralgias  Lab Results  Component Value Date   WBC 6.8 01/18/2016   HGB 10.4* 01/18/2016   HCT 32.6* 01/18/2016   MCV 83.4 01/18/2016   PLT 260 01/18/2016    Lab Results  Component Value Date   CREATININE 0.84 01/19/2016   BUN 9 01/19/2016   NA 135 01/19/2016   K 3.7 01/19/2016   CL 107 01/19/2016   CO2 21* 01/19/2016    Lab Results  Component Value Date   ALT 13* 01/17/2016   AST 17 01/17/2016   ALKPHOS 46 01/17/2016     Microbiology: Recent Results (from the past 240 hour(s))  Blood Culture (routine x 2)     Status: None (Preliminary result)   Collection Time: 01/17/16 10:33 AM  Result Value Ref Range Status   Specimen Description BLOOD LEFT ANTECUBITAL  Final   Special Requests BOTTLES DRAWN AEROBIC AND ANAEROBIC 5ML  Final   Culture   Final    NO GROWTH 2 DAYS Performed at [facility]    Report Status PENDING  Incomplete  Urine culture     Status: Abnormal   Collection Time: 01/17/16 11:00 AM    Result Value Ref Range Status   Specimen Description URINE, CLEAN CATCH  Final   Special Requests NONE  Final   Culture MULTIPLE SPECIES PRESENT, SUGGEST RECOLLECTION (A)  Final   Report Status 01/18/2016 FINAL  Final  Blood Culture (routine x 2)     Status: None (Preliminary result)   Collection Time: 01/17/16 11:25 AM  Result Value Ref Range Status   Specimen Description BLOOD RIGHT ANTECUBITAL  Final   Special Requests BOTTLES DRAWN AEROBIC AND ANAEROBIC 5ML  Final   Culture   Final    NO GROWTH 2 DAYS Performed at [facility]    Report Status PENDING  Incomplete    Impression/Plan:  1. Fever - associated with myalgias, headache, poor appetite, most consistent with a viral syndrome. Continue supportive care, no indication for antibiotics No cough or respiratory issues so will d/c respiratory panel and droplet isolation.   2.  History of JRA - really no labs to support current inflammatory condition.  Though RF can be negative, ESR is normal, CRP negative.  Therefore, I do not feel an empiric trial of prednisone is indicated at this time.

## 2016-01-19 NOTE — Progress Notes (Signed)
Echocardiogram 2D Echocardiogram has been performed.  Dorothey BasemanReel, Tomasa Dobransky M 01/19/2016, 11:04 AM

## 2016-01-19 NOTE — Progress Notes (Signed)
[facility] PROGRESS NOTE  Terri Stafford XQJ:[id number] DOB: 1996-06-26 DOA: 01/17/2016 PCP: No primary care provider on file.  Interim summary and HPI 20 y.o. female with PMH significant for JRA ( currently not taking any treatment) and uncomplicated asthma; who presented to ED complaining of fever, general malaise and myalgias. Symptoms has been present for the last 4-5 days and worsening. In the last 2 days patient has also reported some HA's and feeling sick in her stomach; reason why she has not been eating or drinking much. She denies any dysuria. She denies any diarrhea. Vaginal discharge or bleeding. Patient does not have a history of similar episodes in the past. She denies any recent trips or travel. No known ill contacts. No known tick bites. No rashes or petechiae. On exam she is dehydrated and her UA has elevated specific gravity.  Continue experiencing fever, complaining of myalgias and HA's. ID on board and assisting with work up for FUO. Will follow rec's. Continue Holding abx's for now  Assessment/Plan: 1-FUO (fever of unknown origin): -etiology remains unclear -CXR neg, no abnormalities on UA and normal CT/MRI head -CT abd and pelvis w/o acute abnormalities, but unable to be done with PO contrast (patient become nauseated and couldn't take it) -neg influenza, HIV and mononucleosis; CRP not elevated and normal PCT -patient with normal ESR and normal WBC's -LP unsuccessful -will follow cultures and pending infectious work up as dictated by ID -continue PRN antipyretics, analgesics and supportive care -no antibiotics for now   2-MDD (major depressive disorder) (HCC)/anxiety: -not on any antidepressant currently -denies SI or hallucinations -continue PRN atarax   3-Mild intermittent asthma: -continue PRN duoneb -no wheezing and with good O2 sat  4-JRA (juvenile rheumatoid arthritis) ([place]): -was on methotrexate until approx 2 1/2 years ago -not taking any  medication currently and w/o insurance to follow with rheumatologist  -will try to assist with outpatient follow up at discharge -Dr. Tommy Medal will discussed with Dr. Amil Amen for further rec's and discussed empiric steroids use  5-Hypokalemia: due to poor intake most likely -will replete as needed  -Mg and PO4 essentially WNL  6-Dehydration with hyponatremia: -will continue IVF's resuscitation; currently w/o IV's -follow electrolytes trend; improving  7-migraine: -will continue topamax  Code Status: Full code Family Communication: no family at bedside Disposition Plan: remains inpatient; will follow pending work up and rec's from ID service.   Consultants:  ID  Procedures:  See below for x-ray reports  2-D echo: pending   Antibiotics:  None   HPI/Subjective: Continue spiking fever and even better, still complaining of generalized myalgias and HA's. No further nausea or vomiting.   Objective: Filed Vitals:   01/18/16 2149 01/19/16 0416  BP:  106/57  Pulse:  80  Temp: 99.4 F (37.4 C) 100.5 F (38.1 C)  Resp:  18    Intake/Output Summary (Last 24 hours) at 01/19/16 1024 Last data filed at 01/18/16 1500  Gross per 24 hour  Intake    800 ml  Output      0 ml  Net    800 ml   Filed Weights   01/18/16 0212  Weight: 72.2 kg (159 lb 2.8 oz)    Exam:   General:  Continue spiking fever (Tmax 103 overnight) and complaining of HA's and myalgias (even last two, better today). No further nausea or vomiting.  Cardiovascular: S1 and S2, no rubs or gallops  Respiratory: no wheezing, no crackles, good O2 sat on RA  Abdomen: soft, NT,  no distension appreciated. Positive BS  Musculoskeletal: no joint swelling; no cyanosis or clubbing   Data Reviewed: Basic Metabolic Panel:  Recent Labs Lab 01/17/16 1033 01/17/16 2004 01/18/16 0542 01/19/16 0853  NA 133*  --  132* 135  K 3.4*  --  3.5 3.7  CL 101  --  104 107  CO2 23  --  22 21*  GLUCOSE 128*  --  151*  93  BUN 10  --  <5* 9  CREATININE 0.79  --  0.84 0.84  CALCIUM 8.4*  --  8.2* 8.8*  MG  --  2.0  --   --   PHOS  --  2.4*  --   --    Liver Function Tests:  Recent Labs Lab 01/17/16 1033  AST 17  ALT 13*  ALKPHOS 46  BILITOT 0.7  PROT 7.3  ALBUMIN 4.0    Recent Labs Lab 01/17/16 1819  LIPASE 17   CBC:  Recent Labs Lab 01/17/16 1033 01/18/16 0542  WBC 7.5 6.8  NEUTROABS 4.4  --   HGB 11.0* 10.4*  HCT 34.9* 32.6*  MCV 82.9 83.4  PLT 274 260   Cardiac Enzymes:  Recent Labs Lab 01/18/16 0542  CKTOTAL 113   CBG: No results for input(s): GLUCAP in the last 168 hours.  Recent Results (from the past 240 hour(s))  Blood Culture (routine x 2)     Status: None (Preliminary result)   Collection Time: 01/17/16 10:33 AM  Result Value Ref Range Status   Specimen Description BLOOD LEFT ANTECUBITAL  Final   Special Requests BOTTLES DRAWN AEROBIC AND ANAEROBIC 5ML  Final   Culture   Final    NO GROWTH 1 DAY Performed at [facility]    Report Status PENDING  Incomplete  Urine culture     Status: Abnormal   Collection Time: 01/17/16 11:00 AM  Result Value Ref Range Status   Specimen Description URINE, CLEAN CATCH  Final   Special Requests NONE  Final   Culture MULTIPLE SPECIES PRESENT, SUGGEST RECOLLECTION (A)  Final   Report Status 01/18/2016 FINAL  Final  Blood Culture (routine x 2)     Status: None (Preliminary result)   Collection Time: 01/17/16 11:25 AM  Result Value Ref Range Status   Specimen Description BLOOD RIGHT ANTECUBITAL  Final   Special Requests BOTTLES DRAWN AEROBIC AND ANAEROBIC 5ML  Final   Culture   Final    NO GROWTH < 24 HOURS Performed at [facility]    Report Status PENDING  Incomplete     Studies: Dg Chest 2 View  01/17/2016  CLINICAL DATA:  Fever and cough EXAM: CHEST  2 VIEW COMPARISON:  07/31/2015 FINDINGS: The heart size and mediastinal contours are within normal limits. Both lungs are clear. The visualized  skeletal structures are unremarkable. IMPRESSION: No active cardiopulmonary disease. Electronically Signed   By: Inez Catalina M.D.   On: 01/17/2016 11:21   Ct Head Wo Contrast  01/17/2016  CLINICAL DATA:  Headache, fever, body aches, and cough for few days. EXAM: CT HEAD WITHOUT CONTRAST TECHNIQUE: Contiguous axial images were obtained from the base of the skull through the vertex without intravenous contrast. COMPARISON:  None. FINDINGS: There is no evidence of acute cortical infarct, intracranial hemorrhage, mass, midline shift, or extra-axial fluid collection. Ventricles and sulci are normal in size. A 3 mm hypodense focus in the left occipital lobe (series 2, image 15) is felt to reflect slight asymmetry of the  left occipital horn, of no clinical significance. Visualized orbits are unremarkable. Minimal bilateral ethmoid air cell mucosal thickening is partially visualized. The mastoid air cells are clear. No acute osseous abnormality is seen. IMPRESSION: Negative head CT. Electronically Signed   By: Logan Bores M.D.   On: 01/17/2016 11:31   Mr Jeri Cos RS Contrast  01/18/2016  CLINICAL DATA:  Headache. Intense pain on right side of body. Evaluate for brain abscess. EXAM: MRI HEAD WITHOUT AND WITH CONTRAST TECHNIQUE: Multiplanar, multiecho pulse sequences of the brain and surrounding structures were obtained without and with intravenous contrast. CONTRAST:  58m MULTIHANCE GADOBENATE DIMEGLUMINE 529 MG/ML IV SOLN COMPARISON:  Head CT from yesterday FINDINGS: Calvarium and upper cervical spine: Negative. Enlarged bilateral cervical lymph nodes in the upper cervical chains. Orbits: Negative. Sinuses and Mastoids: Small presumed mucous retention cysts in the left maxillary antrum with mild scattered mucosal thickening in maxillary and ethmoid sinuses bilaterally. No acute sinusitis to explain history of fevers. Brain: Negative for brain abscess. No abnormal intracranial enhancement or sulcal/cisternal FLAIR  hyperintensity to suggest meningitis. No brain edema or infarct. Approximately 6 mm T1 hyperintense comparatively non or hypoenhancing structure in the posterior sella favoring Rathke's cleft cyst. IMPRESSION: 1. Negative for cerebral abscess. No sign of intracranial infection. 2. Nonspecific bilateral cervical adenopathy, presumably related to patient's systemic process. 3. 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 Electronically Signed   By: JMonte FantasiaM.D.   On: 01/18/2016 12:43   Ct Abdomen Pelvis W Contrast  01/17/2016  CLINICAL DATA:  20year old female with fever and headache. Diffuse body aches. EXAM: CT ABDOMEN AND PELVIS WITH CONTRAST TECHNIQUE: Multidetector CT imaging of the abdomen and pelvis was performed using the standard protocol following bolus administration of intravenous contrast. CONTRAST:  1065mISOVUE-300 IOPAMIDOL (ISOVUE-300) INJECTION 61% COMPARISON:  None. FINDINGS: The visualized lung bases are clear. No intra-abdominal free air or free fluid. The liver, gallbladder, pancreas, spleen, adrenal glands, kidneys, visualized ureters, and urinary bladder appear unremarkable. The uterus and ovaries are grossly unremarkable. Evaluation of the bowel is limited in the absence of oral contrast. There is no evidence of bowel obstruction or active inflammation. Normal caliber fluid-filled loops of small bowel, likely physiologic. Enteritis is less likely. Clinical correlation is recommended. Normal appendix. The abdominal aorta and IVC appear unremarkable. The origins of the celiac axis, SMA, IMA as well as the origins of the renal arteries appear patent. No portal venous gas identified. There is no adenopathy. The abdominal wall soft tissues appear unremarkable. 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 No acute fracture. IMPRESSION: No acute intra-abdominal pelvic pathology. Electronically Signed   By: ArAnner Crete.D.   On: 01/17/2016 22:01   Dg Lumbar Puncture Fluoro  Guide  01/17/2016  CLINICAL DATA:  Fever and headache.  Possible meningitis. EXAM: DIAGNOSTIC LUMBAR PUNCTURE UNDER FLUOROSCOPIC GUIDANCE FLUOROSCOPY TIME:  Radiation Exposure Index (as provided by the fluoroscopic device): 38.5 mGy If the device does not provide the exposure index: Fluoroscopy Time (in minutes and seconds): Number of Acquired Images:  None. PROCEDURE: Informed consent was obtained from the patient prior to the procedure, including potential complications of headache, allergy, and pain. With the patient prone, the lower back was prepped with Betadine. 1% Lidocaine was used for local anesthesia. Lumbar puncture was performed at the L3-4 and L4-5 levels using a 28 gauge needle. Unfortunately, no CSF was obtained and the patient asked to terminate the procedure. IMPRESSION: Unsuccessful fluoroscopically guided lumbar puncture. This was discussed with Dr.  Knapp prior to dictation. Electronically Signed   By: Lorin Picket M.D.   On: 01/17/2016 15:08    Scheduled Meds: . heparin  5,000 Units Subcutaneous Q8H  . loratadine  10 mg Oral Daily  . mirtazapine  15 mg Oral QHS  . pantoprazole  40 mg Oral Daily  . potassium phosphate IVPB (mmol)  20 mmol Intravenous Once  . topiramate  50 mg Oral BID   Continuous Infusions:   Principal Problem:   FUO (fever of unknown origin) Active Problems:   MDD (major depressive disorder) (HCC)   Mild intermittent asthma   JRA (juvenile rheumatoid arthritis) ([place])   Hypokalemia   Dehydration with hyponatremia   Arthralgia   Myalgia    Time spent: 35 minutes    Barton Dubois  [facility] Pager [phone]. If 7PM-7AM, please contact night-coverage at [email or web address], password [facility] 01/19/2016, 10:24 AM  LOS: 1 day

## 2016-01-20 LAB — CBC
HCT: 37 % (ref 36.0–46.0)
Hemoglobin: 11.4 g/dL — ABNORMAL LOW (ref 12.0–15.0)
MCH: 26.1 pg (ref 26.0–34.0)
MCHC: 30.8 g/dL (ref 30.0–36.0)
MCV: 84.7 fL (ref 78.0–100.0)
PLATELETS: 293 10*3/uL (ref 150–400)
RBC: 4.37 MIL/uL (ref 3.87–5.11)
RDW: 18.1 % — AB (ref 11.5–15.5)
WBC: 8.4 10*3/uL (ref 4.0–10.5)

## 2016-01-20 LAB — COXSACKIE B VIRUS ANTIBODIES
COXSACKIE B3 AB: NEGATIVE
Coxsackie B6 Ab: 1:64 {titer} — ABNORMAL HIGH

## 2016-01-20 LAB — BASIC METABOLIC PANEL
ANION GAP: 8 (ref 5–15)
BUN: 10 mg/dL (ref 6–20)
CALCIUM: 9.1 mg/dL (ref 8.9–10.3)
CO2: 23 mmol/L (ref 22–32)
CREATININE: 0.97 mg/dL (ref 0.44–1.00)
Chloride: 103 mmol/L (ref 101–111)
GLUCOSE: 88 mg/dL (ref 65–99)
Potassium: 3.9 mmol/L (ref 3.5–5.1)
Sodium: 134 mmol/L — ABNORMAL LOW (ref 135–145)

## 2016-01-20 MED ORDER — ACETAMINOPHEN 325 MG PO TABS: 325.0000 mg | ORAL_TABLET | Freq: Four times a day (QID) | ORAL | Status: DC | PRN

## 2016-01-20 NOTE — Progress Notes (Signed)
Pt left at this time with her "friend". Alert, oriented, and without c/o. Information given on Coxsackie Virus. Discharge instructions given/explained with pt verbalizing understanding.

## 2016-01-20 NOTE — Discharge Summary (Addendum)
Physician Discharge Summary  Terri Stafford PFX:[id number] DOB: 06/08/1996 DOA: 01/17/2016  PCP: No primary care provider on file.  Admit date: 01/17/2016 Discharge date: 01/20/2016  Time spent: 25 minutes  Recommendations for Outpatient Follow-up:  1. Will need to follow with Rheum as OP 2. Will need monitoring with OP PCP in [facility]  Interference with rheumatoid factors Rheumatoid factors are autoantibodies mainly of the IgM class, which preferably bind to IgG immune complexes. The presence of non-specific IgM antibodies (rheumatoid factors) can lead to false-positive results in the IgM assay. Furthermore, the possibility exists, that weak-binding pathogen-specific IgM antibodies may be displaced by stronger-binding IgG antibodies leading to a false negative IgM result. Therefore it is necessary to pretreat samples with rheumatoid factor-absorbens prior to IgM detection (Rf-Absorbent). Rfabsorption is performed by incubation of the sample in Rf-dilution buffer for 15 minutes at room temperature or over night at 4 C. The test procedure is described in a separate instruction manual.   Discharge Diagnoses:  Principal Problem:   FUO (fever of unknown origin) Active Problems:   MDD (major depressive disorder) (HCC)   Mild intermittent asthma   JRA (juvenile rheumatoid arthritis) (HCC)   Hypokalemia   Dehydration with hyponatremia   Arthralgia   Myalgia   Discharge Condition: improved  Diet recommendation: heart healthy  Filed Weights   01/18/16 0212  Weight: 72.2 kg (159 lb 2.8 oz)    History of present illness: 20 y.o. female with PMH significant for JRA ( currently not taking any treatment) and uncomplicated asthma; who presented to ED complaining of fever, general malaise and myalgias. Symptoms has been present for the last 4-5 days and worsening.   In the last 2 days patient has also reported some HA's and feeling sick in her stomach; reason why she has not been eating  or drinking much. She denies any dysuria. She denies any diarrhea. Vaginal discharge or bleeding. Patient does not have a history of similar episodes in the past.  Hospital Course:  -FUO (fever of unknown origin): -etiology remains unclear -CXR neg, no abnormalities on UA and normal CT/MRI head -CT abd and pelvis w/o acute abnormalities, but unable to be done with PO contrast (patient become nauseated and couldn't take it) -neg influenza, HIV and mononucleosis; CRP not elevated and normal PCT -patient with normal ESR and normal WBC's -LP unsuccessful -ECHO showed no specific findings consistent with myocarditis -no antibiotics for now  -Coxsackie Vir +, but unclear if IgM or IGG -no further work-up per ID and stabilized for d/c home 01/20/16  2-MDD (major depressive disorder) (HCC)/anxiety: -not on any antidepressant currently -denies SI or hallucinations -continue PRN atarax   3-Mild intermittent asthma: -continue PRN duoneb -no wheezing and with good O2 sat  4-JRA (juvenile rheumatoid arthritis) ([place]): -was on methotrexate until approx 2 1/2 years ago -not taking any medication currently and w/o insurance to follow with rheumatologist  -will try to assist with outpatient follow up at discharge -Dr. Tommy Medal will discussed with Dr. Amil Amen for further rec's and discussed empiric steroids use -ultimately it was felt patient could be d/c home without steroids  5-Hypokalemia: due to poor intake most likely -will replete as needed  -Mg and PO4 essentially WNL  6-Dehydration with hyponatremia: -will continue IVF's resuscitation; currently w/o IV's -follow electrolytes trend; improving  7-migraine: -will continue topamax  Procedures: Echo  Consultations:  Cardiology  Discharge Exam: Filed Vitals:   01/20/16 0504 01/20/16 1422  BP: 120/89 95/53  Pulse: 50 91  Temp: 97.5  F (36.4 C) 98.6 F (37 C)  Resp: 16 18   Has had some low grade overnight fevers, no chills no  rigors  General: s1 s 2no m/r/g Cardiovascular: s1 s 2no m/r/g Respiratory: clear no added sound  Discharge Instructions   Discharge Instructions    Diet - low sodium heart healthy    Complete by:  As directed      Increase activity slowly    Complete by:  As directed           Current Discharge Medication List    START taking these medications   Details  acetaminophen (TYLENOL) 325 MG tablet Take 1 tablet (325 mg total) by mouth every 6 (six) hours as needed.      CONTINUE these medications which have NOT CHANGED   Details  albuterol (PROVENTIL HFA;VENTOLIN HFA) 108 (90 BASE) MCG/ACT inhaler Inhale 1-2 puffs into the lungs every 4 (four) hours as needed for wheezing or shortness of breath. Qty: 1 Inhaler, Refills: 0    aspirin-acetaminophen-caffeine (EXCEDRIN MIGRAINE) 250-250-65 MG tablet Take 2 tablets by mouth every 4 (four) hours as needed for headache.    camphor-menthol (SARNA) lotion Apply topically 2 (two) times daily. Neck Qty: 222 mL, Refills: 0    hydrOXYzine (ATARAX/VISTARIL) 25 MG tablet Take 1 tablet (25 mg total) by mouth every 6 (six) hours as needed for anxiety. Qty: 45 tablet, Refills: 0    ibuprofen (ADVIL,MOTRIN) 200 MG tablet Take 200 mg by mouth every 6 (six) hours as needed for moderate pain.    mirtazapine (REMERON) 15 MG tablet Take 1 tablet (15 mg total) by mouth at bedtime. Qty: 30 tablet, Refills: 0    Phenyleph-Diphenhyd-DM-APAP (FLU FORMULA PO) Take 30 mLs by mouth daily as needed (flu like symptoms).           The results of significant diagnostics from this hospitalization (including imaging, microbiology, ancillary and laboratory) are listed below for reference.    Significant Diagnostic Studies: Dg Chest 2 View  01/17/2016  CLINICAL DATA:  Fever and cough EXAM: CHEST  2 VIEW COMPARISON:  07/31/2015 FINDINGS: The heart size and mediastinal contours are within normal limits. Both lungs are clear. The visualized skeletal structures  are unremarkable. IMPRESSION: No active cardiopulmonary disease. Electronically Signed   By: Inez Catalina M.D.   On: 01/17/2016 11:21   Ct Head Wo Contrast  01/17/2016  CLINICAL DATA:  Headache, fever, body aches, and cough for few days. EXAM: CT HEAD WITHOUT CONTRAST TECHNIQUE: Contiguous axial images were obtained from the base of the skull through the vertex without intravenous contrast. COMPARISON:  None. FINDINGS: There is no evidence of acute cortical infarct, intracranial hemorrhage, mass, midline shift, or extra-axial fluid collection. Ventricles and sulci are normal in size. A 3 mm hypodense focus in the left occipital lobe (series 2, image 15) is felt to reflect slight asymmetry of the left occipital horn, of no clinical significance. Visualized orbits are unremarkable. Minimal bilateral ethmoid air cell mucosal thickening is partially visualized. The mastoid air cells are clear. No acute osseous abnormality is seen. IMPRESSION: Negative head CT. Electronically Signed   By: Logan Bores M.D.   On: 01/17/2016 11:31   Mr Jeri Cos XN Contrast  01/18/2016  CLINICAL DATA:  Headache. Intense pain on right side of body. Evaluate for brain abscess. EXAM: MRI HEAD WITHOUT AND WITH CONTRAST TECHNIQUE: Multiplanar, multiecho pulse sequences of the brain and surrounding structures were obtained without and with intravenous contrast. CONTRAST:  3m  MULTIHANCE GADOBENATE DIMEGLUMINE 529 MG/ML IV SOLN COMPARISON:  Head CT from yesterday FINDINGS: Calvarium and upper cervical spine: Negative. Enlarged bilateral cervical lymph nodes in the upper cervical chains. Orbits: Negative. Sinuses and Mastoids: Small presumed mucous retention cysts in the left maxillary antrum with mild scattered mucosal thickening in maxillary and ethmoid sinuses bilaterally. No acute sinusitis to explain history of fevers. Brain: Negative for brain abscess. No abnormal intracranial enhancement or sulcal/cisternal FLAIR hyperintensity to  suggest meningitis. No brain edema or infarct. Approximately 6 mm T1 hyperintense comparatively non or hypoenhancing structure in the posterior sella favoring Rathke's cleft cyst. IMPRESSION: 1. Negative for cerebral abscess. No sign of intracranial infection. 2. Nonspecific bilateral cervical adenopathy, presumably related to patient's systemic process. 3. 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 Electronically Signed   By: Monte Fantasia M.D.   On: 01/18/2016 12:43   Ct Abdomen Pelvis W Contrast  01/17/2016  CLINICAL DATA:  20 year old female with fever and headache. Diffuse body aches. EXAM: CT ABDOMEN AND PELVIS WITH CONTRAST TECHNIQUE: Multidetector CT imaging of the abdomen and pelvis was performed using the standard protocol following bolus administration of intravenous contrast. CONTRAST:  161m ISOVUE-300 IOPAMIDOL (ISOVUE-300) INJECTION 61% COMPARISON:  None. FINDINGS: The visualized lung bases are clear. No intra-abdominal free air or free fluid. The liver, gallbladder, pancreas, spleen, adrenal glands, kidneys, visualized ureters, and urinary bladder appear unremarkable. The uterus and ovaries are grossly unremarkable. Evaluation of the bowel is limited in the absence of oral contrast. There is no evidence of bowel obstruction or active inflammation. Normal caliber fluid-filled loops of small bowel, likely physiologic. Enteritis is less likely. Clinical correlation is recommended. Normal appendix. The abdominal aorta and IVC appear unremarkable. The origins of the celiac axis, SMA, IMA as well as the origins of the renal arteries appear patent. No portal venous gas identified. There is no adenopathy. The abdominal wall soft tissues appear unremarkable. 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 No acute fracture. IMPRESSION: No acute intra-abdominal pelvic pathology. Electronically Signed   By: AAnner CreteM.D.   On: 01/17/2016 22:01   Dg Lumbar Puncture Fluoro Guide  01/17/2016  CLINICAL DATA:   Fever and headache.  Possible meningitis. EXAM: DIAGNOSTIC LUMBAR PUNCTURE UNDER FLUOROSCOPIC GUIDANCE FLUOROSCOPY TIME:  Radiation Exposure Index (as provided by the fluoroscopic device): 38.5 mGy If the device does not provide the exposure index: Fluoroscopy Time (in minutes and seconds): Number of Acquired Images:  None. PROCEDURE: Informed consent was obtained from the patient prior to the procedure, including potential complications of headache, allergy, and pain. With the patient prone, the lower back was prepped with Betadine. 1% Lidocaine was used for local anesthesia. Lumbar puncture was performed at the L3-4 and L4-5 levels using a 28 gauge needle. Unfortunately, no CSF was obtained and the patient asked to terminate the procedure. IMPRESSION: Unsuccessful fluoroscopically guided lumbar puncture. This was discussed with Dr. KTomi Bambergerprior to dictation. Electronically Signed   By: MLorin PicketM.D.   On: 01/17/2016 15:08    Microbiology: Recent Results (from the past 240 hour(s))  Blood Culture (routine x 2)     Status: None (Preliminary result)   Collection Time: 01/17/16 10:33 AM  Result Value Ref Range Status   Specimen Description BLOOD LEFT ANTECUBITAL  Final   Special Requests BOTTLES DRAWN AEROBIC AND ANAEROBIC 5ML  Final   Culture   Final    NO GROWTH 3 DAYS [facility]   Report Status PENDING  Incomplete  Urine culture     Status: Abnormal   Collection Time: 01/17/16 11:00 AM  Result Value Ref Range Status   Specimen Description URINE, CLEAN CATCH  Final   Special Requests NONE  Final   Culture MULTIPLE SPECIES PRESENT, SUGGEST RECOLLECTION (A)  Final   Report Status 01/18/2016 FINAL  Final  Blood Culture (routine x 2)     Status: None (Preliminary result)   Collection Time: 01/17/16 11:25 AM  Result Value Ref Range Status   Specimen Description BLOOD RIGHT ANTECUBITAL  Final   Special Requests BOTTLES DRAWN AEROBIC AND ANAEROBIC 5ML  Final    Culture   Final    NO GROWTH 3 DAYS Performed at [facility]    Report Status PENDING  Incomplete     Labs: Basic Metabolic Panel:  Recent Labs Lab 01/17/16 1033 01/17/16 2004 01/18/16 0542 01/19/16 0853 01/20/16 0558  NA 133*  --  132* 135 134*  K 3.4*  --  3.5 3.7 3.9  CL 101  --  104 107 103  CO2 23  --  22 21* 23  GLUCOSE 128*  --  151* 93 88  BUN 10  --  <5* 9 10  CREATININE 0.79  --  0.84 0.84 0.97  CALCIUM 8.4*  --  8.2* 8.8* 9.1  MG  --  2.0  --   --   --   PHOS  --  2.4*  --   --   --    Liver Function Tests:  Recent Labs Lab 01/17/16 1033  AST 17  ALT 13*  ALKPHOS 46  BILITOT 0.7  PROT 7.3  ALBUMIN 4.0    Recent Labs Lab 01/17/16 1819  LIPASE 17   No results for input(s): AMMONIA in the last 168 hours. CBC:  Recent Labs Lab 01/17/16 1033 01/18/16 0542 01/20/16 0558  WBC 7.5 6.8 8.4  NEUTROABS 4.4  --   --   HGB 11.0* 10.4* 11.4*  HCT 34.9* 32.6* 37.0  MCV 82.9 83.4 84.7  PLT 274 260 293   Cardiac Enzymes:  Recent Labs Lab 01/18/16 0542  CKTOTAL 113   BNP: BNP (last 3 results) No results for input(s): BNP in the last 8760 hours.  ProBNP (last 3 results) No results for input(s): PROBNP in the last 8760 hours.  CBG: No results for input(s): GLUCAP in the last 168 hours.     SignedNita Sells MD   [facility] 01/20/2016, 2:44 PM

## 2016-01-20 NOTE — Care Management Note (Signed)
Case Management Note  Patient Details  Name: Taryne Kiger MRN: [id number] Date of Birth: 1996-04-27  Subjective/Objective:                  fever, general malaise and myalgias Action/Plan: CM met with pt in room and gave pt [facility] letter with list of participating pharmacies.  Pt verbalized understanding of all MATCH parameters.  Cm also gave pt [facility] pamphlet and pt verbalized understanding she will GO to clinic on Thursday or Friday morning at 08:30 and ask for: An appointment to secure a PCP; An Appointment with a Navigator to secure insurance; An Appointment for follow up medical care.  Pt states she can get transportation to clinic and is able to secure transportation home.  No other CM needs were communicated. Expected Discharge Date:   (unknown)               Expected Discharge Plan:  Home/Self Care  In-House Referral:     Discharge planning Services  CM Consult, Medication Assistance, [facility], [facility]  Post Acute Care Choice:    Choice offered to:  Patient  DME Arranged:    DME Agency:     HH Arranged:    [place] Agency:     Status of Service:  Completed, signed off  Medicare Important Message Given:    Date Medicare IM Given:    Medicare IM give by:    Date Additional Medicare IM Given:    Additional Medicare Important Message give by:     If discussed at [place] of Stay Meetings, dates discussed:    Additional Comments:  Dellie Catholic, RN 01/20/2016, 3:26 PM

## 2016-01-22 LAB — CULTURE, BLOOD (ROUTINE X 2)
CULTURE: NO GROWTH
CULTURE: NO GROWTH

## 2016-02-12 LAB — ARBOVIRUS PANEL, ~~LOC~~ LAB

## 2016-06-15 IMAGING — CT CT HEAD W/O CM
2 series · 16 of 30 positions shown, 20 images · non-contrast
Comparison: None.

CLINICAL DATA: Headache, fever, body aches, and cough for few days.

EXAM:
CT HEAD WITHOUT CONTRAST
TECHNIQUE: Contiguous axial images were obtained from the base of the skull
through the vertex without intravenous contrast.

[Series 2: head w/o · axial · non-contrast · 0.45mm/px · z∈[+1306,+1426]mm · 13 of 28 slices shown, 17 images]
[im 2/28  brain]
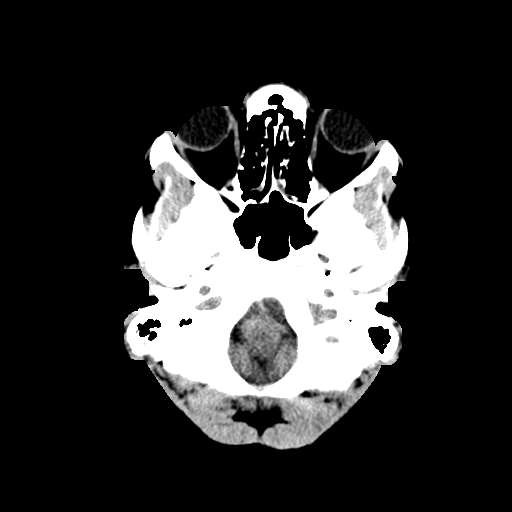
[im 2/28  bone]
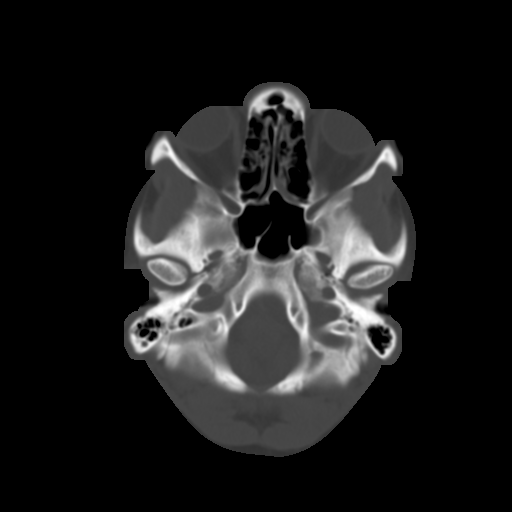
[im 4/28  brain]
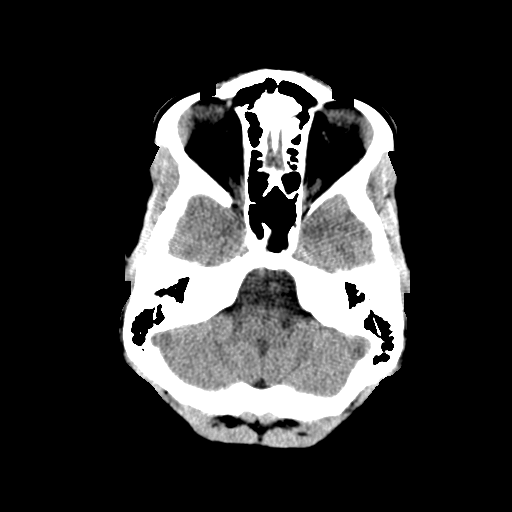
[im 6/28  brain]
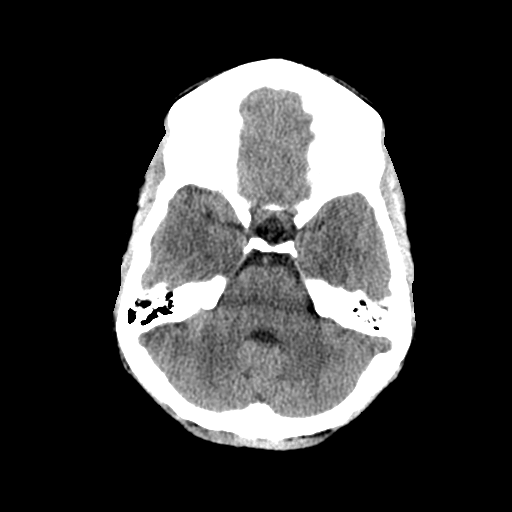
[im 8/28  brain]
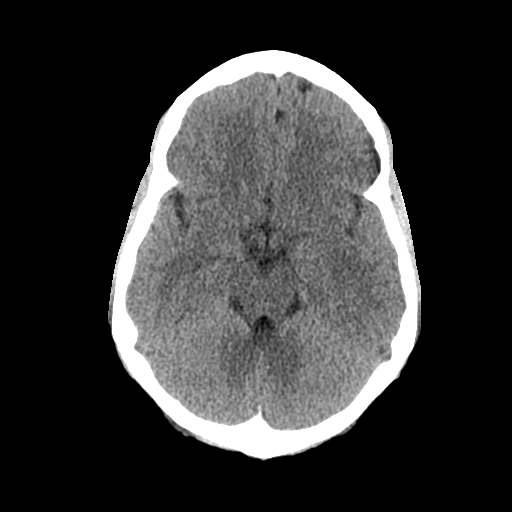
[im 10/28  brain]
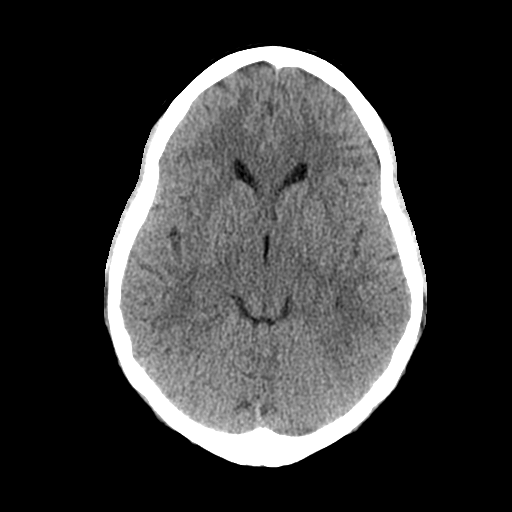
[im 10/28  bone]
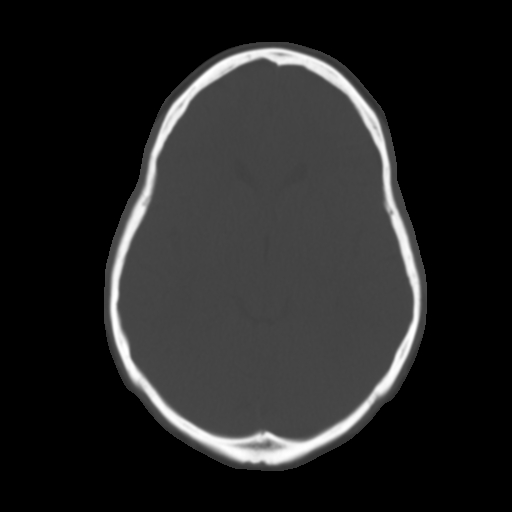
[im 12/28  brain]
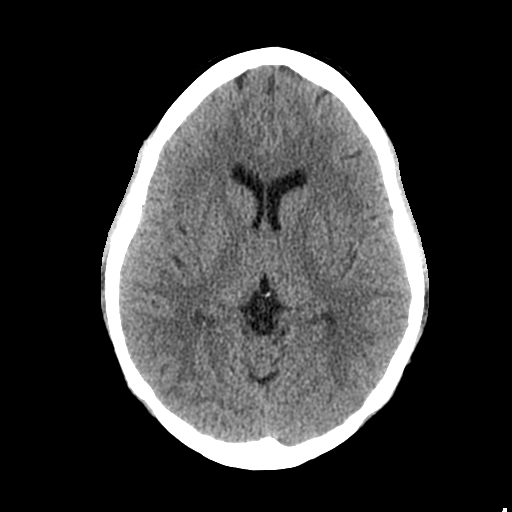
[im 14/28  brain]
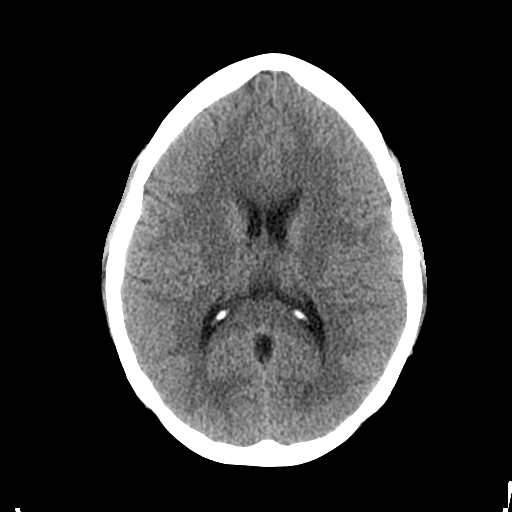
[im 16/28  brain]
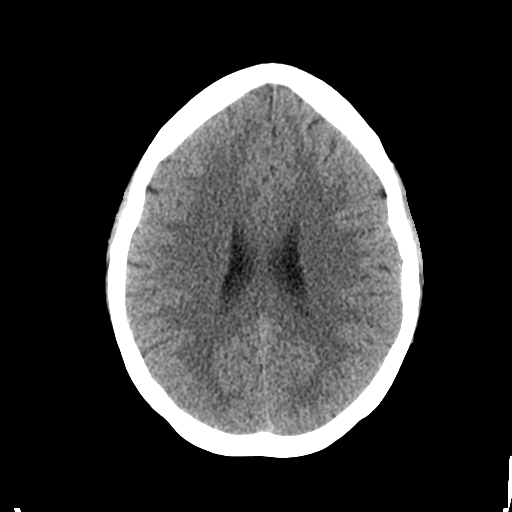
[im 18/28  brain]
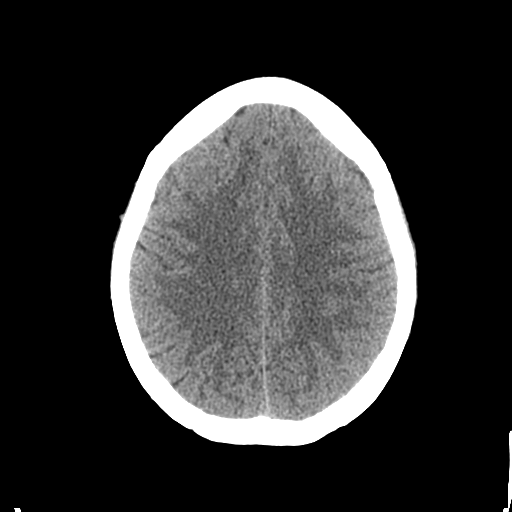
[im 18/28  bone]
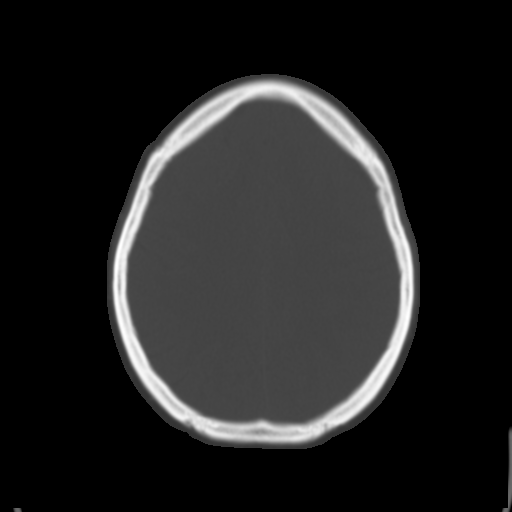
[im 20/28  brain]
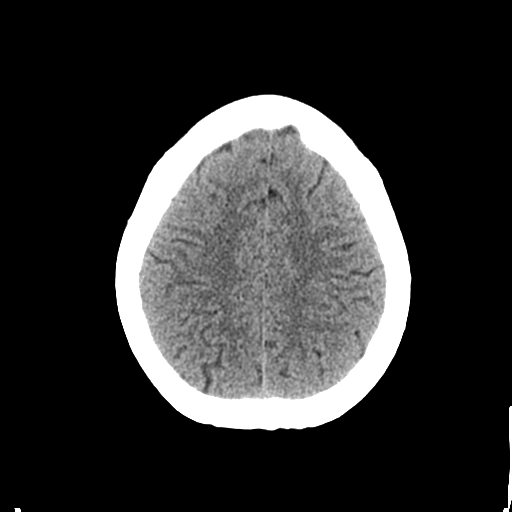
[im 22/28  brain]
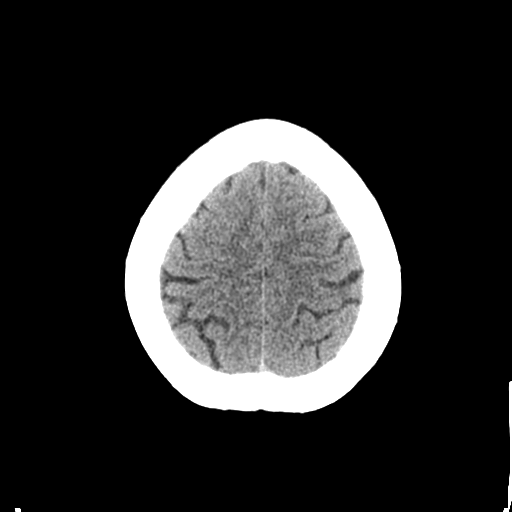
[im 24/28  brain]
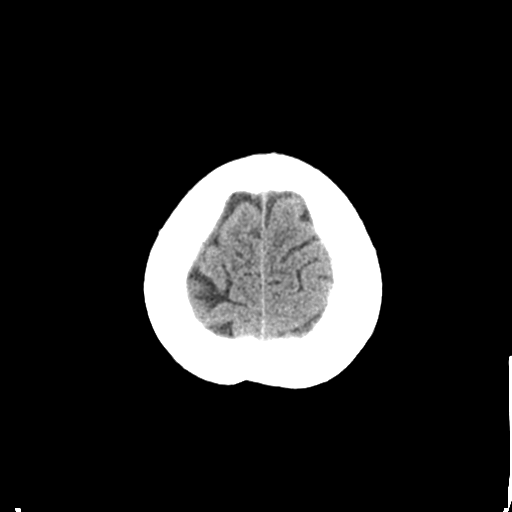
[im 26/28  brain]
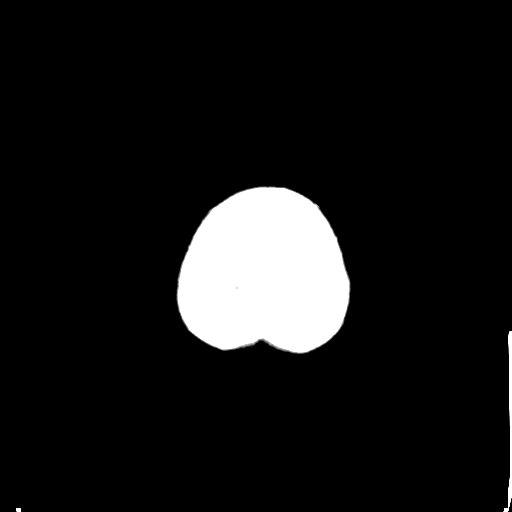
[im 26/28  bone]
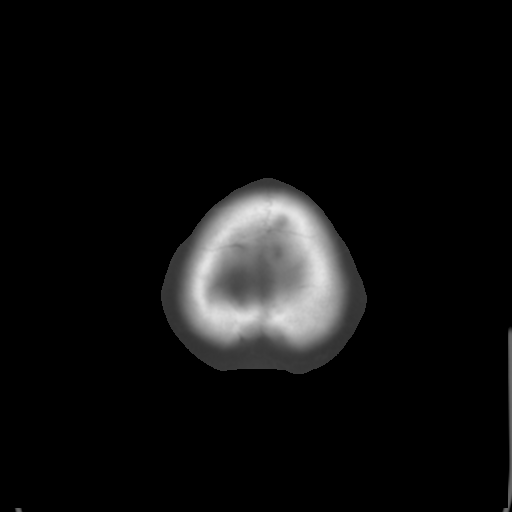

[Series 3: bone windows · axial · 0.45mm/px · z∈[+1306,+1346]mm · 3 of 28 slices shown]
[im 2/28  bone]
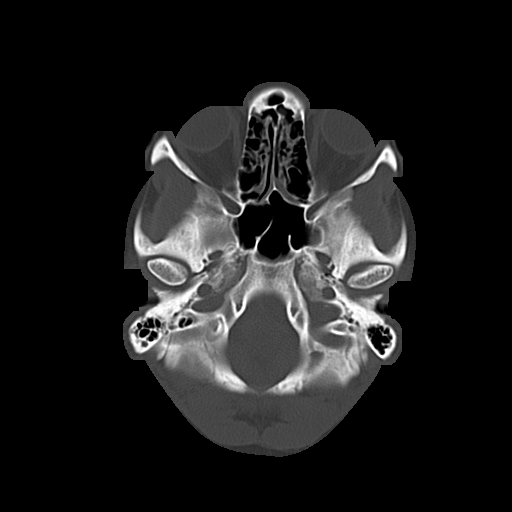
[im 6/28  bone]
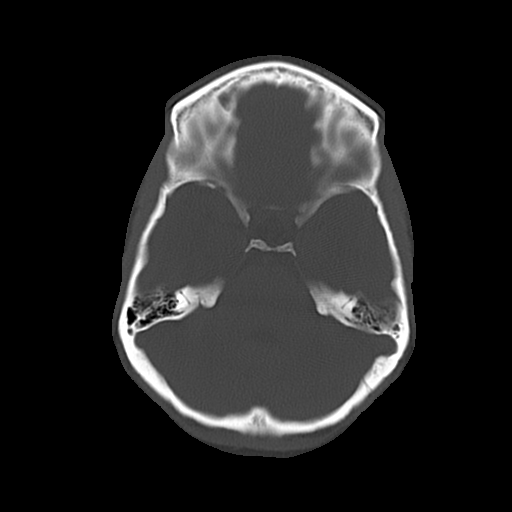
[im 10/28  bone]
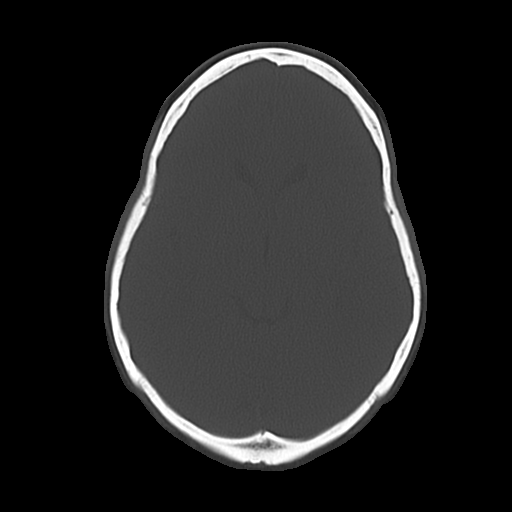

[16 of 30 positions shown; findings below may reference images not displayed]

FINDINGS: There is no evidence of acute cortical infarct, intracranial
hemorrhage, mass, midline shift, or extra-axial fluid collection.
Ventricles and sulci are normal in size. A 3 mm hypodense focus in
the left occipital lobe (series 2, image 15) is felt to reflect
slight asymmetry of the left occipital horn, of no clinical
significance.

Visualized orbits are unremarkable. Minimal bilateral ethmoid air
cell mucosal thickening is partially visualized. The mastoid air
cells are clear. No acute osseous abnormality is seen.
IMPRESSION: Negative head CT.

## 2016-06-15 IMAGING — RF DG FLUORO GUIDE LUMBAR PUNCTURE
1 series · 1 of 1 positions shown · non-contrast
Comparison: none

CLINICAL DATA: Fever and headache.  Possible meningitis.

[Series 1: run · 1 of 1 slices shown]
[im 1/1]
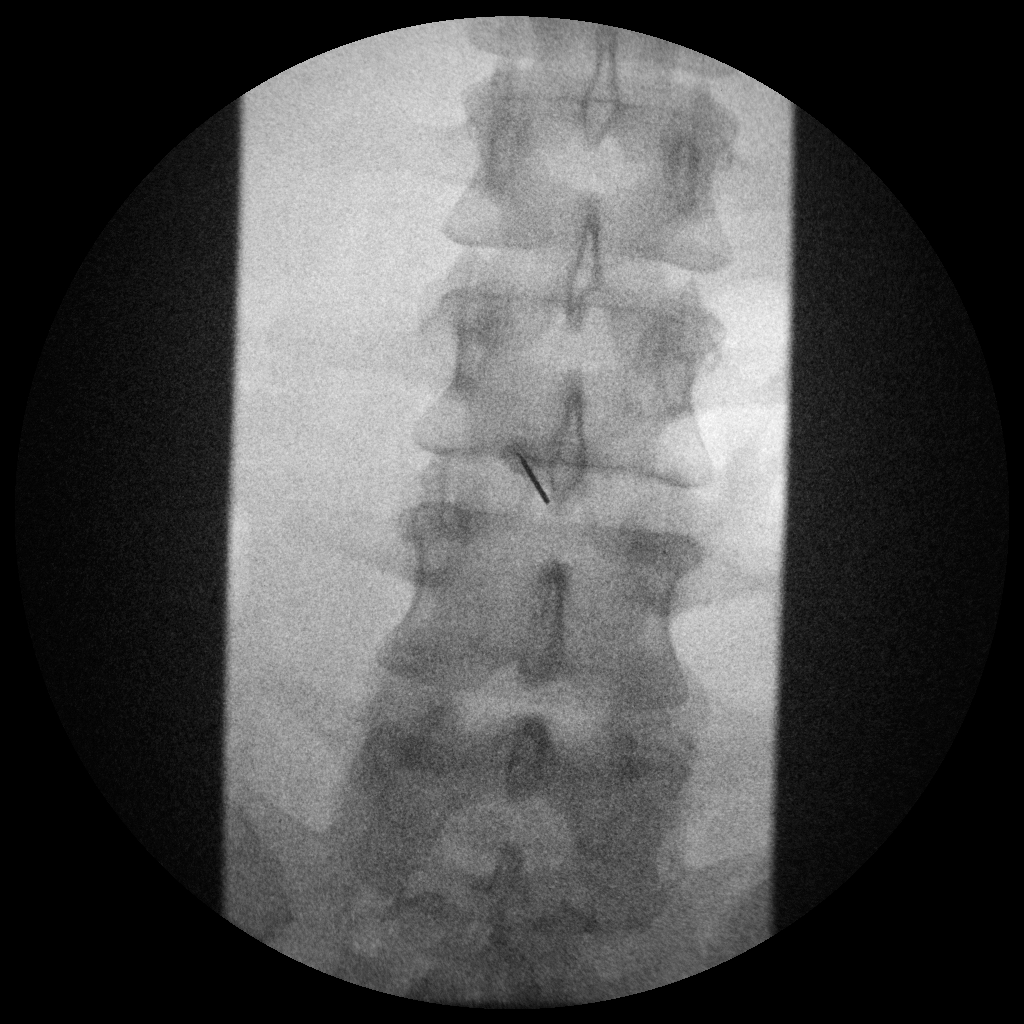

[1 of 1 positions shown; findings below may reference images not displayed]

EXAM:
DIAGNOSTIC LUMBAR PUNCTURE UNDER FLUOROSCOPIC GUIDANCE

FLUOROSCOPY TIME:  Radiation Exposure Index (as provided by the
fluoroscopic device): 38.5 mGy

If the device does not provide the exposure index:

Fluoroscopy Time (in minutes and seconds):

Number of Acquired Images:  None.

PROCEDURE:
Informed consent was obtained from the patient prior to the
procedure, including potential complications of headache, allergy,
and pain. With the patient prone, the lower back was prepped with
Betadine. 1% Lidocaine was used for local anesthesia. Lumbar
puncture was performed at the L3-4 and L4-5 levels using a 28 gauge
needle. Unfortunately, no CSF was obtained and the patient asked to
terminate the procedure.
IMPRESSION: Unsuccessful fluoroscopically guided lumbar puncture. This was
discussed with Dr. Gral prior to dictation.

## 2016-09-14 ENCOUNTER — Inpatient Hospital Stay (HOSPITAL_COMMUNITY): Payer: Medicaid Other

## 2016-09-14 ENCOUNTER — Encounter (HOSPITAL_COMMUNITY): Payer: Self-pay | Admitting: *Deleted

## 2016-09-14 ENCOUNTER — Inpatient Hospital Stay (HOSPITAL_COMMUNITY)
Admission: AD | Admit: 2016-09-14 | Discharge: 2016-09-14 | Disposition: A | Discharge status: Home / Self Care | Payer: Medicaid Other | Source: Ambulatory Visit | Attending: Family Medicine | Admitting: Family Medicine

## 2016-09-14 DIAGNOSIS — R102 Pelvic and perineal pain: Secondary | ICD-10-CM | POA: Diagnosis present

## 2016-09-14 DIAGNOSIS — O26891 Other specified pregnancy related conditions, first trimester: Secondary | ICD-10-CM

## 2016-09-14 DIAGNOSIS — O9989 Other specified diseases and conditions complicating pregnancy, childbirth and the puerperium: Secondary | ICD-10-CM | POA: Insufficient documentation

## 2016-09-14 DIAGNOSIS — Z3491 Encounter for supervision of normal pregnancy, unspecified, first trimester: Secondary | ICD-10-CM

## 2016-09-14 DIAGNOSIS — K5901 Slow transit constipation: Secondary | ICD-10-CM | POA: Diagnosis not present

## 2016-09-14 DIAGNOSIS — Z3A08 8 weeks gestation of pregnancy: Secondary | ICD-10-CM | POA: Diagnosis not present

## 2016-09-14 DIAGNOSIS — N739 Female pelvic inflammatory disease, unspecified: Secondary | ICD-10-CM | POA: Diagnosis not present

## 2016-09-14 DIAGNOSIS — N730 Acute parametritis and pelvic cellulitis: Secondary | ICD-10-CM | POA: Diagnosis not present

## 2016-09-14 DIAGNOSIS — O26899 Other specified pregnancy related conditions, unspecified trimester: Secondary | ICD-10-CM

## 2016-09-14 LAB — WET PREP, GENITAL
CLUE CELLS WET PREP: NONE SEEN
SPERM: NONE SEEN
TRICH WET PREP: NONE SEEN
Yeast Wet Prep HPF POC: NONE SEEN

## 2016-09-14 LAB — URINALYSIS, ROUTINE W REFLEX MICROSCOPIC
BILIRUBIN URINE: NEGATIVE
GLUCOSE, UA: NEGATIVE mg/dL
HGB URINE DIPSTICK: NEGATIVE
Ketones, ur: NEGATIVE mg/dL
Leukocytes, UA: NEGATIVE
Nitrite: NEGATIVE
PROTEIN: NEGATIVE mg/dL
SPECIFIC GRAVITY, URINE: 1.018 (ref 1.005–1.030)
pH: 8 (ref 5.0–8.0)

## 2016-09-14 LAB — CBC
HCT: 34.6 % — ABNORMAL LOW (ref 36.0–46.0)
Hemoglobin: 11.4 g/dL — ABNORMAL LOW (ref 12.0–15.0)
MCH: 27.5 pg (ref 26.0–34.0)
MCHC: 32.9 g/dL (ref 30.0–36.0)
MCV: 83.4 fL (ref 78.0–100.0)
PLATELETS: 318 10*3/uL (ref 150–400)
RBC: 4.15 MIL/uL (ref 3.87–5.11)
RDW: 17.1 % — AB (ref 11.5–15.5)
WBC: 12.6 10*3/uL — AB (ref 4.0–10.5)

## 2016-09-14 LAB — HCG, QUANTITATIVE, PREGNANCY: hCG, Beta Chain, Quant, S: 165983 m[IU]/mL — ABNORMAL HIGH (ref <5)

## 2016-09-14 LAB — POCT PREGNANCY, URINE: PREG TEST UR: POSITIVE — AB

## 2016-09-14 MED ORDER — POLYETHYLENE GLYCOL 3350 17 GM/SCOOP PO POWD: 17.0000 g | Freq: Every day | ORAL | 0 refills | Status: AC

## 2016-09-14 MED ORDER — AZITHROMYCIN 250 MG PO TABS
1000.0000 mg | ORAL_TABLET | Freq: Once | ORAL | Status: AC
  Administered 2016-09-14: 1000 mg via ORAL
  Filled 2016-09-14: qty 4

## 2016-09-14 NOTE — Discharge Instructions (Signed)
Constipation, Adult °Constipation is when a person: °· Poops (has a bowel movement) fewer times in a week than normal. °· Has a hard time pooping. °· Has poop that is dry, hard, or bigger than normal. ° °Follow these instructions at home: °Eating and drinking ° °· Eat foods that have a lot of fiber, such as: °? Fresh fruits and vegetables. °? Whole grains. °? Beans. °· Eat less of foods that are high in fat, low in fiber, or overly processed, such as: °? French fries. °? Hamburgers. °? Cookies. °? Candy. °? Soda. °· Drink enough fluid to keep your pee (urine) clear or pale yellow. °General instructions °· Exercise regularly or as told by your doctor. °· Go to the restroom when you feel like you need to poop. Do not hold it in. °· Take over-the-counter and prescription medicines only as told by your doctor. These include any fiber supplements. °· Do pelvic floor retraining exercises, such as: °? Doing deep breathing while relaxing your lower belly (abdomen). °? Relaxing your pelvic floor while pooping. °· Watch your condition for any changes. °· Keep all follow-up visits as told by your doctor. This is important. °Contact a doctor if: °· You have pain that gets worse. °· You have a fever. °· You have not pooped for 4 days. °· You throw up (vomit). °· You are not hungry. °· You lose weight. °· You are bleeding from the anus. °· You have thin, pencil-like poop (stool). °Get help right away if: °· You have a fever, and your symptoms suddenly get worse. °· You leak poop or have blood in your poop. °· Your belly feels hard or bigger than normal (is bloated). °· You have very bad belly pain. °· You feel dizzy or you faint. °This information is not intended to replace advice given to you by your health care provider. Make sure you discuss any questions you have with your health care provider. °Document Released: 02/05/2008 Document Revised: 03/08/2016 Document Reviewed: 02/07/2016 °Elsevier Interactive Patient Education ©  2017 [employer] ° °

## 2016-09-14 NOTE — MAU Note (Signed)
Pt states she found out that she was pregnant one month ago.  Pt states she went to the doctor because she was having severe pain and cramping and had not had a period.  Pt state after the doctor's office she went to the ER and was told she had a cyst that ruptured.  Pt states that everything hurts including sneezing and coughing.  Pt states her back pain shoot up the left side and cause chest pain.  PT states it worries her because she has severe asthma.  Pt states she has to pee every 5 minutes.  Pt states she has been having headaches.  Pt states she is still cramping too and doesn't know why.

## 2016-09-14 NOTE — MAU Provider Note (Signed)
History     CSN: [id number]  Arrival date and time: 09/14/16 1218   First Provider Initiated Contact with Patient 09/14/16 1255      Chief Complaint  Patient presents with  . Abdominal Pain  . Back Pain   [id number] @[redacted]w[redacted]d  by LMP here with pelvic pain and left flank pain. She describes as constant and sharp. She has used Tylenol and heating pad and had no relief. Agrevating factors are movement and position changes. She also reports polyuria x2 weeks. She denies dysuria and hematuria. She was seen for similar pain about 3 weeks ago. Review of records reveals an ED visit on 08/27/16 for abdominal pain. She reports an [place] that showed an early pregnancy and possible ruptured ovarian cyst. By her reports she was seen again twice in [place] and was told she had uterine fibroid. She has a new sexual partner and reports only one encounter (when she got pregnant)    OB History    Gravida Para Term Preterm AB Living   2 1 1          SAB TAB Ectopic Multiple Live Births                  Past Medical History:  Diagnosis Date  . 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 Asthma   . MDD (major depressive disorder)     Past Surgical History:  Procedure Laterality Date  . TONSILLECTOMY      History reviewed. No pertinent family history.  Social History  Substance Use Topics  . Smoking status: Never Smoker  . Smokeless tobacco: Never Used  . Alcohol use No    Allergies:  Allergies  Allergen Reactions  . 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 Peanut-Containing Drug Products Anaphylaxis    All Nuts   . Penicillins Anaphylaxis and Hives     Has patient had a PCN reaction causing immediate rash, facial/tongue/throat swelling, SOB or lightheadedness with hypotension: Yes Has patient had a PCN reaction causing severe rash involving mucus membranes or skin necrosis: No Has patient had a PCN reaction that required hospitalization-yes  Has patient had a PCN reaction  occurring within the last 10 years: No If all of the above answers are "NO", then may proceed with Cephalosporin use.   [name] Apple Hives  . Banana Hives  . Orange Fruit [Citrus] Hives  . Soy Allergy Nausea And Vomiting  . Strawberry Flavor Hives    Prescriptions Prior to Admission  Medication Sig Dispense Refill Last Dose  . acetaminophen (TYLENOL) 325 MG tablet Take 1 tablet (325 mg total) by mouth every 6 (six) hours as needed.     [name] albuterol (PROVENTIL HFA;VENTOLIN HFA) 108 (90 BASE) MCG/ACT inhaler Inhale 1-2 puffs into the lungs every 4 (four) hours as needed for wheezing or shortness of breath. 1 Inhaler 0 unknown  . aspirin-acetaminophen-caffeine (EXCEDRIN MIGRAINE) 250-250-65 MG tablet Take 2 tablets by mouth every 4 (four) hours as needed for headache.   01/16/2016 at Unknown time  . camphor-menthol (SARNA) lotion Apply topically 2 (two) times daily. Neck 222 mL 0 havent started  . hydrOXYzine (ATARAX/VISTARIL) 25 MG tablet Take 1 tablet (25 mg total) by mouth every 6 (six) hours as needed for anxiety. 45 tablet 0 havent started  . ibuprofen (ADVIL,MOTRIN) 200 MG tablet Take 200 mg by mouth every 6 (six) hours as needed for moderate pain.   01/14/2016  . mirtazapine (REMERON) 15 MG tablet Take 1  tablet (15 mg total) by mouth at bedtime. 30 tablet 0 havent started  . Phenyleph-Diphenhyd-DM-APAP (FLU FORMULA PO) Take 30 mLs by mouth daily as needed (flu like symptoms).   01/14/2016    Review of Systems  Constitutional: Negative for chills and fever.  Gastrointestinal: Positive for constipation.  Genitourinary: Positive for frequency, pelvic pain and vaginal discharge (no odor, yellow, thick). Negative for dysuria and vaginal bleeding.   Physical Exam   Blood pressure 121/69, pulse 65, temperature 98 F (36.7 C), temperature source Oral, resp. rate 18, height 5\' 1"  (1.549 m), weight 69.8 kg (153 lb 12.8 oz), last menstrual period 07/19/2016, SpO2 100 %.  Physical Exam  Nursing note  and vitals reviewed. Constitutional: She is oriented to person, place, and time. She appears well-developed and well-nourished. No distress (appears mildly uncomfortable).  HENT:  Head: Normocephalic.  Neck: Normal range of motion.  Cardiovascular: Normal rate.   Respiratory: Effort normal.  GI: Soft. She exhibits no distension and no mass. There is tenderness in the right lower quadrant, suprapubic area and left lower quadrant. There is no rigidity, no rebound, no guarding and no CVA tenderness.  Genitourinary:  Genitourinary Comments: External: no lesions or erythema Vagina: rugated, parous, thin, mucusy, yellow discharge Uterus: mildly enlarged, anteverted, exquisitely tender, + CMT Adnexae: no masses, ++ tenderness left, + tenderness right   Musculoskeletal: Normal range of motion.  Left flank tenderness  Neurological: She is alert and oriented to person, place, and time.  Skin: Skin is warm and dry.  Psychiatric: She has a normal mood and affect.   Results for orders placed or performed during the hospital encounter of 09/14/16 (from the past 24 hour(s))  Urinalysis, Routine w reflex microscopic     Status: Abnormal   Collection Time: 09/14/16 12:26 PM  Result Value Ref Range   Color, Urine YELLOW YELLOW   APPearance CLOUDY (A) CLEAR   Specific Gravity, Urine 1.018 1.005 - 1.030   pH 8.0 5.0 - 8.0   Glucose, UA NEGATIVE NEGATIVE mg/dL   Hgb urine dipstick NEGATIVE NEGATIVE   Bilirubin Urine NEGATIVE NEGATIVE   Ketones, ur NEGATIVE NEGATIVE mg/dL   Protein, ur NEGATIVE NEGATIVE mg/dL   Nitrite NEGATIVE NEGATIVE   Leukocytes, UA NEGATIVE NEGATIVE  Pregnancy, urine POC     Status: Abnormal   Collection Time: 09/14/16 12:41 PM  Result Value Ref Range   Preg Test, Ur POSITIVE (A) NEGATIVE  Wet prep, genital     Status: Abnormal   Collection Time: 09/14/16  1:15 PM  Result Value Ref Range   Yeast Wet Prep HPF POC NONE SEEN NONE SEEN   Trich, Wet Prep NONE SEEN NONE SEEN    Clue Cells Wet Prep HPF POC NONE SEEN NONE SEEN   WBC, Wet Prep HPF POC MANY (A) NONE SEEN   Sperm NONE SEEN   CBC     Status: Abnormal   Collection Time: 09/14/16  1:44 PM  Result Value Ref Range   WBC 12.6 (H) 4.0 - 10.5 K/uL   RBC 4.15 3.87 - 5.11 MIL/uL   Hemoglobin 11.4 (L) 12.0 - 15.0 g/dL   HCT 40.9 (L) 81.1 - 91.4 %   MCV 83.4 78.0 - 100.0 fL   MCH 27.5 26.0 - 34.0 pg   MCHC 32.9 30.0 - 36.0 g/dL   RDW 78.2 (H) 95.6 - 21.3 %   Platelets 318 150 - 400 K/uL  hCG, quantitative, pregnancy     Status: Abnormal   Collection Time:  09/14/16  1:55 PM  Result Value Ref Range   hCG, Beta Chain, Quant, S 165,983 (H) <5 mIU/mL   [place] Ob Comp Less 14 Wks  Result Date: 09/14/2016 CLINICAL DATA:  Pelvic pain for the past month. Eight weeks and 1 day pregnant by last menstrual period. EXAM: OBSTETRIC <14 WK ULTRASOUND TECHNIQUE: Transabdominal ultrasound was performed for evaluation of the gestation as well as the maternal uterus and adnexal regions. The patient refused a transvaginal ultrasound examination. COMPARISON:  Pelvic CT dated 01/17/2016. FINDINGS: Intrauterine gestational sac: Visualized Yolk sac:  Visualized Embryo:  Visualized Cardiac Activity: Visualized Heart Rate: 155 bpm CRL:   1.38  mm   7 w 4 d                  US EDC: 04/29/2017 Subchorionic hemorrhage:  None visualized. Maternal uterus/adnexae: Normal appearing ovaries. Left ovarian corpus luteum. IMPRESSION: Single live intrauterine gestation with an estimated gestational age of [redacted] weeks and 4 days. No complicating features. Electronically Signed   By: Beckie SaltsSteven  Reid M.D.   On: 09/14/2016 14:52   MAU Course  Procedures Heating pad to abdomen Azithromycin 1 gm po  MDM Labs and US ordered and reviewed. No evidence of UTI, pyelo, or nephrolithiasis. Normal IUP on US c/w dates, CLC present on left. Pain may be related to constipation but based on hx of new partner, physical exam and wet prep of many WCBs and bacteria will treat for  suspected PID. She has anaphylaxis reaction to PCN and unsure of cephalosporin tolerance therefore will treat with Azithro today and if CT/GC is positive will bring back for Gentamycin 2gm IM and another Azithromycin 1 gm dose (per Sue LushAndrea in pharmacy). Stable for discharge home.   Assessment and Plan   1. [redacted] weeks gestation of pregnancy   2. Pelvic pain affecting pregnancy   3. Normal intrauterine pregnancy on prenatal ultrasound in first trimester   4. PID (acute pelvic inflammatory disease)   5. Slow transit constipation    Discharge home Follow [facility] worsening sx  Allergies as of 09/14/2016      Reactions   Mold Extract [trichophyton] Anaphylaxis   Other Anaphylaxis, Other (See Comments)   Pt is allergic to all nuts.     Peanut-containing Drug Products Anaphylaxis   Penicillins Anaphylaxis, Hives, Other (See Comments)   Has patient had a PCN reaction causing immediate rash, facial/tongue/throat swelling, SOB or lightheadedness with hypotension: Yes Has patient had a PCN reaction causing severe rash involving mucus membranes or skin necrosis: No Has patient had a PCN reaction that required hospitalization No Has patient had a PCN reaction occurring within the last 10 years: No If all of the above answers are "NO", then may proceed with Cephalosporin use.   Apple Hives   Banana Hives   Food Hives, Other (See Comments)   Pt is allergic to oranges and strawberries.     Soy Allergy Nausea And Vomiting      Medication List    TAKE these medications   acetaminophen 325 MG tablet Commonly known as:  TYLENOL Take 650 mg by mouth every 6 (six) hours as needed for mild pain, moderate pain or headache.   albuterol (2.5 MG/3ML) 0.083% nebulizer solution Commonly known as:  PROVENTIL Take 2.5 mg by nebulization every 4 (four) hours as needed for wheezing or shortness of breath.   albuterol 108 (90 Base) MCG/ACT inhaler Commonly known  as:  PROVENTIL HFA;VENTOLIN HFA Inhale 1-2 puffs into the lungs every 4 (four) hours as needed for wheezing or shortness of breath.   budesonide-formoterol 80-4.5 MCG/ACT inhaler Commonly known as:  SYMBICORT Inhale 2 puffs into the lungs 2 (two) times daily.   mometasone 0.1 % cream Commonly known as:  ELOCON Apply 1 application topically 2 (two) times daily.   polyethylene glycol powder powder Commonly known as:  MIRALAX Take 17 g by mouth daily.      Donette Larry, CNM 09/14/2016, 12:56 PM

## 2016-09-16 LAB — GC/CHLAMYDIA PROBE AMP (~~LOC~~) NOT AT ARMC
Chlamydia: NEGATIVE
NEISSERIA GONORRHEA: NEGATIVE

## 2017-07-18 ENCOUNTER — Encounter (HOSPITAL_COMMUNITY): Payer: Self-pay
